# Patient Record
Sex: Male | Born: 1940 | Race: White | Hispanic: No | Marital: Single | Smoking: Former smoker
Health system: Southern US, Community
[De-identification: ages and names within clinical notes are randomized; demographics above are authoritative.]

## PROBLEM LIST (undated history)

## (undated) ENCOUNTER — Ambulatory Visit: Admission: EM | Payer: Medicare Other

## (undated) DIAGNOSIS — Z86011 Personal history of benign neoplasm of the brain: Secondary | ICD-10-CM

## (undated) DIAGNOSIS — I10 Essential (primary) hypertension: Secondary | ICD-10-CM

## (undated) HISTORY — PX: PROSTATE SURGERY: SHX751

## (undated) HISTORY — PX: BRAIN SURGERY: SHX531

## (undated) HISTORY — PX: APPENDECTOMY: SHX54

---

## 2018-01-10 ENCOUNTER — Ambulatory Visit
Admission: EM | Admit: 2018-01-10 | Discharge: 2018-01-10 | Disposition: A | Discharge status: Home / Self Care | Payer: Medicare Other | Attending: Family Medicine | Admitting: Family Medicine

## 2018-01-10 ENCOUNTER — Other Ambulatory Visit: Payer: Self-pay

## 2018-01-10 ENCOUNTER — Ambulatory Visit: Payer: Medicare Other

## 2018-01-10 ENCOUNTER — Encounter: Payer: Self-pay | Admitting: Emergency Medicine

## 2018-01-10 ENCOUNTER — Ambulatory Visit (INDEPENDENT_AMBULATORY_CARE_PROVIDER_SITE_OTHER): Payer: Medicare Other

## 2018-01-10 DIAGNOSIS — S50911A Unspecified superficial injury of right forearm, initial encounter: Secondary | ICD-10-CM | POA: Diagnosis not present

## 2018-01-10 DIAGNOSIS — M7989 Other specified soft tissue disorders: Secondary | ICD-10-CM

## 2018-01-10 DIAGNOSIS — Z23 Encounter for immunization: Secondary | ICD-10-CM

## 2018-01-10 DIAGNOSIS — S51811A Laceration without foreign body of right forearm, initial encounter: Secondary | ICD-10-CM

## 2018-01-10 DIAGNOSIS — S51812A Laceration without foreign body of left forearm, initial encounter: Secondary | ICD-10-CM

## 2018-01-10 HISTORY — DX: Essential (primary) hypertension: I10

## 2018-01-10 HISTORY — DX: Personal history of benign neoplasm of the brain: Z86.011

## 2018-01-10 MED ORDER — TETANUS-DIPHTH-ACELL PERTUSSIS 5-2.5-18.5 LF-MCG/0.5 IM SUSP
0.5000 mL | Freq: Once | INTRAMUSCULAR | Status: AC
  Administered 2018-01-10: 0.5 mL via INTRAMUSCULAR

## 2018-01-10 MED ORDER — ACETAMINOPHEN 500 MG PO TABS
1000.0000 mg | ORAL_TABLET | Freq: Once | ORAL | Status: AC
  Administered 2018-01-10: 1000 mg via ORAL

## 2018-01-10 MED ORDER — LIDOCAINE-EPINEPHRINE-TETRACAINE (LET) SOLUTION
3.0000 mL | Freq: Once | NASAL | Status: AC
  Administered 2018-01-10: 3 mL via TOPICAL

## 2018-01-10 MED ORDER — AMOXICILLIN 500 MG PO CAPS: 500.0000 mg | ORAL_CAPSULE | Freq: Three times a day (TID) | ORAL | 0 refills | Status: DC

## 2018-01-10 NOTE — Discharge Instructions (Signed)
Follow up in 7-8 days for suture removal or sooner if any problems

## 2018-01-10 NOTE — ED Provider Notes (Signed)
[facility]    CSN: [id number] Arrival date & time: 01/10/18  1205     History   Chief Complaint Chief Complaint  Patient presents with  . Extremity Laceration    right lower forearm    HPI Clinton Richardson is a 77 y.o. male.   77 yo male presents with a c/o laceration to right forearm after falling in a pond and lacerating it with a broken tree branch in the water. Last tetanus over 7 years ago.   The history is provided by the patient.    Past Medical History:  Diagnosis Date  . Hypertension   . Personal history of benign brain tumor     There are no active problems to display for this patient.   Past Surgical History:  Procedure Laterality Date  . APPENDECTOMY    . BRAIN SURGERY     benign brain tumor  . PROSTATE SURGERY         Home Medications    Prior to Admission medications   Medication Sig Start Date End Date Taking? Authorizing Provider  ASPIRIN 81 PO Take 1 tablet by mouth daily. 06/18/09  Yes [provider]  hydrochlorothiazide (HYDRODIURIL) 25 MG tablet Take 1 tablet by mouth daily. 04/30/17  Yes [provider]  lisinopril (PRINIVIL,ZESTRIL) 40 MG tablet Take 1 tablet by mouth daily. 05/04/17  Yes [provider]  Omega-3 1000 MG CAPS Take 1 tablet by mouth daily. 06/30/17  Yes [provider]  amoxicillin (AMOXIL) 500 MG capsule Take 1 capsule (500 mg total) by mouth 3 (three) times daily. 01/10/18   Norval Gable, MD    Family History Family History  Problem Relation Age of Onset  . Ovarian cancer Mother   . Heart attack Father   . Hypertension Father     Social History Social History   Tobacco Use  . Smoking status: Former Smoker    Last attempt to quit: 01/11/1968    Years since quitting: 50.0  . Smokeless tobacco: Never Used  Substance Use Topics  . Alcohol use: Yes    Comment: rarely  . Drug use: Never     Allergies   Azithromycin and Codeine   Review of Systems Review of  Systems   Physical Exam Triage Vital Signs ED Triage Vitals  Enc Vitals Group     BP 01/10/18 1255 (!) 143/81     Pulse Rate 01/10/18 1255 63     Resp 01/10/18 1255 16     Temp 01/10/18 1255 98.1 F (36.7 C)     Temp Source 01/10/18 1255 Oral     SpO2 01/10/18 1255 96 %     Weight 01/10/18 1256 210 lb (95.3 kg)     Height 01/10/18 1256 5\' 11"  (1.803 m)     Head Circumference --      Peak Flow --      Pain Score 01/10/18 1255 4     Pain Loc --      Pain Edu? --      Excl. in [place]? --    No data found.  Updated Vital Signs BP (!) 143/81 (BP Location: Left Arm)   Pulse 63   Temp 98.1 F (36.7 C) (Oral)   Resp 16   Ht 5\' 11"  (1.803 m)   Wt 95.3 kg   SpO2 96%   BMI 29.29 kg/m   Visual Acuity Right Eye Distance:   Left Eye Distance:   Bilateral Distance:    Right  Eye Near:   Left Eye Near:    Bilateral Near:     Physical Exam  Constitutional: He appears well-developed and well-nourished. No distress.  Musculoskeletal:       Right wrist: Normal.       Right forearm: He exhibits tenderness, swelling and laceration (2.5cm laceration). He exhibits no bony tenderness.       Right hand: Normal. He exhibits normal range of motion and normal capillary refill. Normal strength noted.  Skin: He is not diaphoretic.  Vitals reviewed.    UC Treatments / Results  Labs (all labs ordered are listed, but only abnormal results are displayed) Labs Reviewed - No data to display  EKG None  Radiology Dg Forearm Right  Result Date: 01/10/2018 CLINICAL DATA:  Pain following fall EXAM: RIGHT FOREARM - 2 VIEW COMPARISON:  None. FINDINGS: Frontal and lateral views were obtained. There is soft tissue injury volar to the distal radius and ulna. No radiopaque foreign body. No fracture or dislocation. Joint spaces appear normal. No elbow joint effusion. Pronator quadratus fat pad is not elevated. IMPRESSION: Soft tissue injury volar to the distal radius and ulna. No radiopaque foreign  body. No fracture or dislocation. No evident arthropathy. Electronically Signed   By: Lowella Grip III M.D.   On: 01/10/2018 13:38    Procedures Laceration Repair Date/Time: 01/10/2018 5:08 PM Performed by: Norval Gable, MD Authorized by: Norval Gable, MD   Consent:    Consent obtained:  Verbal   Consent given by:  Patient   Risks discussed:  Infection, need for additional repair, nerve damage, poor wound healing, poor cosmetic result, pain, retained foreign body, tendon damage and vascular damage   Alternatives discussed:  No treatment Anesthesia (see MAR for exact dosages):    Anesthesia method:  Topical application and local infiltration   Topical anesthetic:  LET   Local anesthetic:  Lidocaine 1% w/o epi Laceration details:    Location:  Shoulder/arm   Shoulder/arm location:  R lower arm   Length (cm):  2.5 Repair type:    Repair type:  Simple Pre-procedure details:    Preparation:  Patient was prepped and draped in usual sterile fashion and imaging obtained to evaluate for foreign bodies Exploration:    Hemostasis achieved with:  Direct pressure and LET   Wound exploration: wound explored through full range of motion and entire depth of wound probed and visualized     Wound extent: areolar tissue violated     Wound extent: no fascia violation noted, no foreign bodies/material noted, no muscle damage noted, no nerve damage noted, no tendon damage noted, no underlying fracture noted and no vascular damage noted   Treatment:    Area cleansed with:  Betadine and Hibiclens   Amount of cleaning:  Standard   Irrigation method:  Syringe   Foreign body removal: no foreign bodies visualized.   Skin repair:    Repair method:  Sutures   Suture size:  5-0   Suture material:  Nylon   Suture technique:  Simple interrupted   Number of sutures:  5 Approximation:    Approximation:  Close Post-procedure details:    Dressing:  Antibiotic ointment and non-adherent dressing    Patient tolerance of procedure:  Tolerated well, no immediate complications   (including critical care time)  Medications Ordered in UC Medications  acetaminophen (TYLENOL) tablet 1,000 mg (1,000 mg Oral Given 01/10/18 1314)  Tdap (BOOSTRIX) injection 0.5 mL (0.5 mLs Intramuscular Given 01/10/18 1347)  lidocaine-EPINEPHrine-tetracaine (LET)  solution (3 mLs Topical Given 01/10/18 1428)    Initial Impression / Assessment and Plan / UC Course  I have reviewed the triage vital signs and the nursing notes.  Pertinent labs & imaging results that were available during my care of the patient were reviewed by me and considered in my medical decision making (see chart for details).      Final Clinical Impressions(s) / UC Diagnoses   Final diagnoses:  Forearm laceration, left, initial encounter     Discharge Instructions     Follow up in 7-8 days for suture removal or sooner if any problems    ED Prescriptions    Medication Sig Dispense Auth. Provider   amoxicillin (AMOXIL) 500 MG capsule Take 1 capsule (500 mg total) by mouth 3 (three) times daily. 21 capsule Norval Gable, MD     1. x-ray results and diagnosis reviewed with patient; tdap given 2. rx as per orders above; reviewed possible side effects, interactions, risks and benefits; prophylaxis due to dirty pond water 3. Procedure note as above 4. Recommend supportive treatment with routine wound care 4. Follow-up in 7-8 days for suture removal or sooner prn if any problems Controlled Substance Prescriptions [place] Controlled Substance Registry consulted? Not Applicable   Norval Gable, MD 01/10/18 [phone]

## 2018-01-10 NOTE — ED Triage Notes (Signed)
Patient's last Tdap was 02/03/12 at [facility] per Care Everywhere.

## 2018-01-10 NOTE — ED Triage Notes (Signed)
Patient in today stating that he tri[place] on [place] arm on another tree stob. Patient unsure of last tetanus.

## 2018-01-17 ENCOUNTER — Ambulatory Visit
Admission: EM | Admit: 2018-01-17 | Discharge: 2018-01-17 | Disposition: A | Discharge status: Home / Self Care | Payer: Medicare Other | Attending: Family Medicine | Admitting: Family Medicine

## 2018-01-17 ENCOUNTER — Encounter: Payer: Self-pay | Admitting: Emergency Medicine

## 2018-01-17 ENCOUNTER — Other Ambulatory Visit: Payer: Self-pay

## 2018-01-17 DIAGNOSIS — Z4802 Encounter for removal of sutures: Secondary | ICD-10-CM

## 2018-01-17 MED ORDER — MUPIROCIN 2 % EX OINT: 1.0000 "application " | TOPICAL_OINTMENT | Freq: Two times a day (BID) | CUTANEOUS | 0 refills | Status: AC

## 2018-01-17 NOTE — Discharge Instructions (Signed)
Use the prescription topical.  Take care  Dr. Lacinda Axon

## 2018-01-17 NOTE — ED Triage Notes (Signed)
Pt is here today for suture removal.  Pt reports that he lost about half of the antibiotic so he did not finish it. Pt would like a provider to look at his wound, he is concerned for infection.

## 2018-01-17 NOTE — ED Provider Notes (Signed)
[facility]    CSN: [id number] Arrival date & time: 01/17/18  1020  History   Chief Complaint Chief Complaint  Patient presents with  . Suture / Staple Removal   HPI   77 year old male presents for suture removal.  Patient is concerned about infection and wanted a provider to examine him.  Patient suffered a laceration on 9/30.  It was repaired without difficulty.  Patient presents today for suture removal.  Patient states that the area is still swollen.  No drainage.  Patient lost his prescription after taking it for several days.  He is concerned that he may be developing an infection and wants to be examined.  Patient thinks he needs antibiotics.  No fever.  No chills.  No other associated symptoms.  No other complaints.  PMH, Surgical Hx, Family Hx, Social History reviewed and updated as below.  Past Medical History:  Diagnosis Date  . Hypertension   . Personal history of benign brain tumor    Past Surgical History:  Procedure Laterality Date  . APPENDECTOMY    . BRAIN SURGERY     benign brain tumor  . PROSTATE SURGERY     Home Medications    Prior to Admission medications   Medication Sig Start Date End Date Taking? Authorizing Provider  ASPIRIN 81 PO Take 1 tablet by mouth daily. 06/18/09  Yes [provider]  hydrochlorothiazide (HYDRODIURIL) 25 MG tablet Take 1 tablet by mouth daily. 04/30/17  Yes [provider]  lisinopril (PRINIVIL,ZESTRIL) 40 MG tablet Take 1 tablet by mouth daily. 05/04/17  Yes [provider]  Omega-3 1000 MG CAPS Take 1 tablet by mouth daily. 06/30/17  Yes [provider]  amoxicillin (AMOXIL) 500 MG capsule Take 1 capsule (500 mg total) by mouth 3 (three) times daily. 01/10/18   Norval Gable, MD  mupirocin ointment (BACTROBAN) 2 % Apply 1 application topically 2 (two) times daily for 7 days. 01/17/18 01/24/18  Coral Spikes, DO    Family History Family History  Problem Relation Age of Onset  .  Ovarian cancer Mother   . Heart attack Father   . Hypertension Father     Social History Social History   Tobacco Use  . Smoking status: Former Smoker    Last attempt to quit: 01/11/1968    Years since quitting: 50.0  . Smokeless tobacco: Never Used  Substance Use Topics  . Alcohol use: Yes    Comment: rarely  . Drug use: Never   Allergies   Azithromycin and Codeine   Review of Systems Review of Systems  Constitutional: Negative.   Skin: Positive for wound.   Physical Exam Triage Vital Signs ED Triage Vitals  Enc Vitals Group     BP 01/17/18 1039 (!) 153/73     Pulse Rate 01/17/18 1039 73     Resp 01/17/18 1039 16     Temp 01/17/18 1039 97.8 F (36.6 C)     Temp Source 01/17/18 1039 Oral     SpO2 01/17/18 1039 98 %     Weight 01/17/18 1037 205 lb (93 kg)     Height 01/17/18 1037 5\' 11"  (1.803 m)     Head Circumference --      Peak Flow --      Pain Score 01/17/18 1036 3     Pain Loc --      Pain Edu? --      Excl. in [place]? --    Updated Vital Signs BP Marland Kitchen)  153/73 (BP Location: Left Arm)   Pulse 73   Temp 97.8 F (36.6 C) (Oral)   Resp 16   Ht 5\' 11"  (1.803 m)   Wt 93 kg   SpO2 98%   BMI 28.59 kg/m   Visual Acuity Right Eye Distance:   Left Eye Distance:   Bilateral Distance:    Right Eye Near:   Left Eye Near:    Bilateral Near:     Physical Exam  Constitutional: He is oriented to person, place, and time. He appears well-developed. No distress.  HENT:  Head: Normocephalic and atraumatic.  Pulmonary/Chest: Effort normal. No respiratory distress.  Neurological: He is alert and oriented to person, place, and time.  Skin:  Right forearm (ventrally) -wound appears well-healed.  Sutures were removed by nursing staff without difficulty.  No drainage.  Psychiatric: He has a normal mood and affect. His behavior is normal.  Nursing note and vitals reviewed.  UC Treatments / Results  Labs (all labs ordered are listed, but only abnormal results are  displayed) Labs Reviewed - No data to display  EKG None  Radiology No results found.  Procedures Procedures (including critical care time) Sutures removed by nursing staff without difficulty.  Medications Ordered in UC Medications - No data to display  Initial Impression / Assessment and Plan / UC Course  I have reviewed the triage vital signs and the nursing notes.  Pertinent labs & imaging results that were available during my care of the patient were reviewed by me and considered in my medical decision making (see chart for details).    77 year old male presents for suture removal. Sutures removed without difficulty.  Mupirocin as directed. Supportive care.  Final Clinical Impressions(s) / UC Diagnoses   Final diagnoses:  Visit for suture removal     Discharge Instructions     Use the prescription topical.  Take care  Dr. Lacinda Axon     ED Prescriptions    Medication Sig Dispense Auth. Provider   mupirocin ointment (BACTROBAN) 2 % Apply 1 application topically 2 (two) times daily for 7 days. 22 g Coral Spikes, DO     Controlled Substance Prescriptions [place] Controlled Substance Registry consulted? Not Applicable   Coral Spikes, DO 01/17/18 1109

## 2018-02-12 ENCOUNTER — Ambulatory Visit
Admission: EM | Admit: 2018-02-12 | Discharge: 2018-02-12 | Disposition: A | Discharge status: Home / Self Care | Payer: Medicare Other | Attending: Family Medicine | Admitting: Family Medicine

## 2018-02-12 ENCOUNTER — Other Ambulatory Visit: Payer: Self-pay

## 2018-02-12 ENCOUNTER — Ambulatory Visit: Payer: Medicare Other

## 2018-02-12 DIAGNOSIS — W231XXA Caught, crushed, jammed, or pinched between stationary objects, initial encounter: Secondary | ICD-10-CM

## 2018-02-12 DIAGNOSIS — I10 Essential (primary) hypertension: Secondary | ICD-10-CM | POA: Diagnosis not present

## 2018-02-12 DIAGNOSIS — W230XXA Caught, crushed, jammed, or pinched between moving objects, initial encounter: Secondary | ICD-10-CM | POA: Insufficient documentation

## 2018-02-12 DIAGNOSIS — S60011A Contusion of right thumb without damage to nail, initial encounter: Secondary | ICD-10-CM

## 2018-02-12 DIAGNOSIS — Z87891 Personal history of nicotine dependence: Secondary | ICD-10-CM | POA: Diagnosis not present

## 2018-02-12 DIAGNOSIS — Z23 Encounter for immunization: Secondary | ICD-10-CM

## 2018-02-12 DIAGNOSIS — Z8249 Family history of ischemic heart disease and other diseases of the circulatory system: Secondary | ICD-10-CM | POA: Insufficient documentation

## 2018-02-12 DIAGNOSIS — Z79899 Other long term (current) drug therapy: Secondary | ICD-10-CM | POA: Diagnosis not present

## 2018-02-12 DIAGNOSIS — S61011A Laceration without foreign body of right thumb without damage to nail, initial encounter: Secondary | ICD-10-CM | POA: Insufficient documentation

## 2018-02-12 DIAGNOSIS — Z7982 Long term (current) use of aspirin: Secondary | ICD-10-CM | POA: Insufficient documentation

## 2018-02-12 MED ORDER — LIDOCAINE HCL (PF) 1 % IJ SOLN
10.0000 mL | Freq: Once | INTRAMUSCULAR | Status: AC
  Administered 2018-02-12: 10 mL

## 2018-02-12 MED ORDER — MUPIROCIN 2 % EX OINT: TOPICAL_OINTMENT | CUTANEOUS | 0 refills | Status: DC

## 2018-02-12 MED ORDER — CEPHALEXIN 500 MG PO CAPS: 500.0000 mg | ORAL_CAPSULE | Freq: Three times a day (TID) | ORAL | 0 refills | Status: AC

## 2018-02-12 MED ORDER — TETANUS-DIPHTH-ACELL PERTUSSIS 5-2.5-18.5 LF-MCG/0.5 IM SUSP
0.5000 mL | Freq: Once | INTRAMUSCULAR | Status: AC
  Administered 2018-02-12: 0.5 mL via INTRAMUSCULAR

## 2018-02-12 NOTE — ED Triage Notes (Signed)
Pt states he got the skin between the thumb and pointer finger pinch in the trailer hitch today. Pt has a lac noted with controlled bleeding

## 2018-02-12 NOTE — ED Provider Notes (Signed)
[facility] ____________________________________________  Time seen: Approximately 3:18 PM  I have reviewed the triage vital signs and the nursing notes.   HISTORY  Chief Complaint Laceration   HPI Clinton Richardson is a 77 y.o. male presenting for evaluation of right hand pain post injury with laceration that occurred approximately 2 hours prior to arrival.  Patient reports that he was unhooking a Marketing executive and his right hand got pinched at the bottom of his thumb as well as at the top.  Reports mild pain to the area.  Has not cleaned her irrigated wound.  No alleviating measures attempted.  States pain is worse with direct palpation.  States that he felt he needed a stitch to 1 of the cuts prompting him to come in.  Reports last tetanus immunization was in 2013 greater than 5 years ago.  Denies paresthesias, pain radiation or other complaints.  Reports otherwise doing well denies other complaints.  Denies recent sickness or antibodies.  Jeraldine Loots, MD: PCP  Past Medical History:  Diagnosis Date  . Hypertension   . Personal history of benign brain tumor     There are no active problems to display for this patient.   Past Surgical History:  Procedure Laterality Date  . APPENDECTOMY    . BRAIN SURGERY     benign brain tumor  . PROSTATE SURGERY       No current facility-administered medications for this encounter.   Current Outpatient Medications:  .  amoxicillin (AMOXIL) 500 MG capsule, Take 1 capsule (500 mg total) by mouth 3 (three) times daily., Disp: 21 capsule, Rfl: 0 .  ASPIRIN 81 PO, Take 1 tablet by mouth daily., Disp: , Rfl:  .  cephALEXin (KEFLEX) 500 MG capsule, Take 1 capsule (500 mg total) by mouth 3 (three) times daily for 5 days., Disp: 15 capsule, Rfl: 0 .  hydrochlorothiazide (HYDRODIURIL) 25 MG tablet, Take 1 tablet by mouth daily., Disp: , Rfl:  .  lisinopril (PRINIVIL,ZESTRIL) 40 MG tablet, Take 1 tablet by mouth daily., Disp: , Rfl:    .  mupirocin ointment (BACTROBAN) 2 %, Apply two times a day for 7 days., Disp: 22 g, Rfl: 0 .  Omega-3 1000 MG CAPS, Take 1 tablet by mouth daily., Disp: , Rfl:   Allergies Azithromycin and Codeine  Family History  Problem Relation Age of Onset  . Ovarian cancer Mother   . Heart attack Father   . Hypertension Father     Social History Social History   Tobacco Use  . Smoking status: Former Smoker    Last attempt to quit: 01/11/1968    Years since quitting: 50.1  . Smokeless tobacco: Never Used  Substance Use Topics  . Alcohol use: Yes    Comment: rarely  . Drug use: Never    Review of Systems Constitutional: No fever/chills Cardiovascular: Denies chest pain. Respiratory: Denies shortness of breath. Musculoskeletal: Negative for back pain. Skin: as above.    ____________________________________________   PHYSICAL EXAM:  VITAL SIGNS: ED Triage Vitals  Enc Vitals Group     BP 02/12/18 1455 (!) 152/105     Pulse Rate 02/12/18 1455 83     Resp 02/12/18 1455 16     Temp 02/12/18 1455 97.7 F (36.5 C)     Temp Source 02/12/18 1455 Oral     SpO2 02/12/18 1455 96 %     Weight 02/12/18 1456 205 lb (93 kg)     Height 02/12/18 1456 5\' 11"  (1.803 m)  Head Circumference --      Peak Flow --      Pain Score 02/12/18 1455 3     Pain Loc --      Pain Edu? --      Excl. in [place]? --     Constitutional: Alert and oriented. Well appearing and in no acute distress. ENT      Head: Normocephalic and atraumatic. Cardiovascular: Normal rate, regular rhythm. Grossly normal heart sounds.  Good peripheral circulation. Respiratory: Normal respiratory effort without tachypnea nor retractions. Breath sounds are clear and equal bilaterally. No wheezes, rales, rhonchi. Musculoskeletal: Bilateral distal radial pulses equal and easily palpated. Right hand palmar aspect proximal MCP 3.5 cm flap laceration present with mild active bleeding, dorsal base of thumb 1 cm superficial linear  laceration non-gapping, no foreign bodies visualized, mild pain at proximal first phalanx and MCP joint, full range of motion present, no motor or tendon deficits noted, good resisted flexion and extension. Neurologic:  Normal speech and language.  Speech is normal. No gait instability.  Skin:  Skin is warm, dry.  As above Psychiatric: Mood and affect are normal. Speech and behavior are normal. Patient exhibits appropriate insight and judgment   ___________________________________________   LABS (all labs ordered are listed, but only abnormal results are displayed)  Labs Reviewed - No data to display  RADIOLOGY  Dg Finger Thumb Right  Result Date: 02/12/2018 CLINICAL DATA:  Pinching injury today with pain and swelling, initial encounter EXAM: RIGHT THUMB 2+V COMPARISON:  None. FINDINGS: There is no evidence of fracture or dislocation. There is no evidence of arthropathy or other focal bone abnormality. Soft tissues are unremarkable IMPRESSION: No acute abnormality noted. Electronically Signed   By: Inez Catalina M.D.   On: 02/12/2018 15:47   ____________________________________________   PROCEDURES Procedures   Procedure(s) performed:  Procedure explained and verbal consent obtained. Consent: Verbal consent obtained. Written consent not obtained. Risks and benefits: risks, benefits and alternatives were discussed Patient identity confirmed: verbally with patient and hospital-assigned identification number  Consent given by: patient   Laceration Repair Location: right thumb Length: 3.5 cm Foreign bodies: no foreign bodies Tendon involvement: none Nerve involvement: none Preparation: Patient was prepped and draped in the usual sterile fashion. Anesthesia with 1% Lidocaine 6 mls Cleaned with Betadine Irrigation solution: saline Irrigation method: jet lavage Amount of cleaning: copious Repaired with 5-0 nylon Number of sutures: 7 Technique: simple interrupted  Approximation:  loose Patient tolerate well. Wound well approximated post repair.  Antibiotic ointment and dressing applied.  Wound care instructions provided.  Observe for any signs of infection or other problems.      INITIAL IMPRESSION / ASSESSMENT AND PLAN / ED COURSE  Pertinent labs & imaging results that were available during my care of the patient were reviewed by me and considered in my medical decision making (see chart for details).  Well-appearing patient.  No acute distress.  Right hand mechanical injury that occurred prior to arrival with laceration.  X-ray as above per radiologist and reviewed by myself, no acute abnormality noted.  Laceration copiously cleaned, irrigated and repaired as above.  Tetanus immunization updated.  Will Rx empiric Keflex and topical Bactroban.  Discussed keeping clean and wound care.  Return to urgent care in 7 to 10 days for suture removal.Discussed indication, risks and benefits of medications with patient.   Discussed follow up and return parameters including no resolution or any worsening concerns. Patient verbalized understanding and agreed to plan.  ____________________________________________   FINAL CLINICAL IMPRESSION(S) / ED DIAGNOSES  Final diagnoses:  Laceration of right thumb without foreign body without damage to nail, initial encounter  Contusion of right thumb without damage to nail, initial encounter     ED Discharge Orders         Ordered    cephALEXin (KEFLEX) 500 MG capsule  3 times daily     02/12/18 1611    mupirocin ointment (BACTROBAN) 2 %     02/12/18 1611           Note: This dictation was prepared with Dragon dictation along with smaller phrase technology. Any transcriptional errors that result from this process are unintentional.         Marylene Land, NP 02/12/18 1703

## 2018-02-12 NOTE — Discharge Instructions (Addendum)
Take medication as prescribed. Keep clean and dry as discussed. Monitor.   Return in 7-10 days for suture removal.   Follow up with your primary care physician this week as needed. Return to Urgent care for new or worsening concerns.

## 2018-02-22 ENCOUNTER — Ambulatory Visit
Admission: EM | Admit: 2018-02-22 | Discharge: 2018-02-22 | Disposition: A | Discharge status: Home / Self Care | Payer: Medicare Other

## 2018-02-22 NOTE — ED Triage Notes (Signed)
Pt with 7 sutures to right hand. No sx infection. Edges well approximated. No pain

## 2018-02-22 NOTE — ED Notes (Signed)
7 sutures removed from right palm.

## 2019-05-28 ENCOUNTER — Other Ambulatory Visit: Payer: Self-pay

## 2019-05-28 ENCOUNTER — Emergency Department: Payer: Medicare PPO

## 2019-05-28 ENCOUNTER — Emergency Department
Admission: EM | Admit: 2019-05-28 | Discharge: 2019-05-28 | Disposition: A | Discharge status: Home / Self Care | Payer: Medicare PPO | Attending: Emergency Medicine | Admitting: Emergency Medicine

## 2019-05-28 ENCOUNTER — Encounter: Payer: Self-pay | Admitting: Emergency Medicine

## 2019-05-28 DIAGNOSIS — Z7982 Long term (current) use of aspirin: Secondary | ICD-10-CM | POA: Insufficient documentation

## 2019-05-28 DIAGNOSIS — Z79899 Other long term (current) drug therapy: Secondary | ICD-10-CM | POA: Diagnosis not present

## 2019-05-28 DIAGNOSIS — Y939 Activity, unspecified: Secondary | ICD-10-CM | POA: Insufficient documentation

## 2019-05-28 DIAGNOSIS — Y92007 Garden or yard of unspecified non-institutional (private) residence as the place of occurrence of the external cause: Secondary | ICD-10-CM | POA: Insufficient documentation

## 2019-05-28 DIAGNOSIS — S29011A Strain of muscle and tendon of front wall of thorax, initial encounter: Secondary | ICD-10-CM | POA: Diagnosis not present

## 2019-05-28 DIAGNOSIS — X500XXA Overexertion from strenuous movement or load, initial encounter: Secondary | ICD-10-CM | POA: Diagnosis not present

## 2019-05-28 DIAGNOSIS — Y999 Unspecified external cause status: Secondary | ICD-10-CM | POA: Diagnosis not present

## 2019-05-28 DIAGNOSIS — I10 Essential (primary) hypertension: Secondary | ICD-10-CM | POA: Diagnosis not present

## 2019-05-28 DIAGNOSIS — S299XXA Unspecified injury of thorax, initial encounter: Secondary | ICD-10-CM | POA: Diagnosis present

## 2019-05-28 DIAGNOSIS — Z87891 Personal history of nicotine dependence: Secondary | ICD-10-CM | POA: Diagnosis not present

## 2019-05-28 LAB — CBC WITH DIFFERENTIAL/PLATELET
Abs Immature Granulocytes: 0.02 10*3/uL (ref 0.00–0.07)
Basophils Absolute: 0.1 10*3/uL (ref 0.0–0.1)
Basophils Relative: 1 %
Eosinophils Absolute: 0.2 10*3/uL (ref 0.0–0.5)
Eosinophils Relative: 2 %
HCT: 43.4 % (ref 39.0–52.0)
Hemoglobin: 14.9 g/dL (ref 13.0–17.0)
Immature Granulocytes: 0 %
Lymphocytes Relative: 29 %
Lymphs Abs: 1.9 10*3/uL (ref 0.7–4.0)
MCH: 29.7 pg (ref 26.0–34.0)
MCHC: 34.3 g/dL (ref 30.0–36.0)
MCV: 86.6 fL (ref 80.0–100.0)
Monocytes Absolute: 0.6 10*3/uL (ref 0.1–1.0)
Monocytes Relative: 9 %
Neutro Abs: 4 10*3/uL (ref 1.7–7.7)
Neutrophils Relative %: 59 %
Platelets: 252 10*3/uL (ref 150–400)
RBC: 5.01 MIL/uL (ref 4.22–5.81)
RDW: 13.3 % (ref 11.5–15.5)
WBC: 6.6 10*3/uL (ref 4.0–10.5)
nRBC: 0 % (ref 0.0–0.2)

## 2019-05-28 LAB — FIBRIN DERIVATIVES D-DIMER (ARMC ONLY): Fibrin derivatives D-dimer (ARMC): 522.06 ng/mL (FEU) — ABNORMAL HIGH (ref 0.00–499.00)

## 2019-05-28 LAB — COMPREHENSIVE METABOLIC PANEL
ALT: 28 U/L (ref 0–44)
AST: 25 U/L (ref 15–41)
Albumin: 3.8 g/dL (ref 3.5–5.0)
Alkaline Phosphatase: 67 U/L (ref 38–126)
Anion gap: 6 (ref 5–15)
BUN: 11 mg/dL (ref 8–23)
CO2: 29 mmol/L (ref 22–32)
Calcium: 8.7 mg/dL — ABNORMAL LOW (ref 8.9–10.3)
Chloride: 105 mmol/L (ref 98–111)
Creatinine, Ser: 1.09 mg/dL (ref 0.61–1.24)
GFR calc Af Amer: >60 mL/min (ref >60)
GFR calc non Af Amer: >60 mL/min (ref >60)
Glucose, Bld: 105 mg/dL — ABNORMAL HIGH (ref 70–99)
Potassium: 3.7 mmol/L (ref 3.5–5.1)
Sodium: 140 mmol/L (ref 135–145)
Total Bilirubin: 0.6 mg/dL (ref 0.3–1.2)
Total Protein: 6.7 g/dL (ref 6.5–8.1)

## 2019-05-28 LAB — TROPONIN I (HIGH SENSITIVITY)
Troponin I (High Sensitivity): 5 ng/L (ref <18)
Troponin I (High Sensitivity): 3 ng/L (ref <18)

## 2019-05-28 MED ORDER — PREDNISONE 10 MG PO TABS: 10.0000 mg | ORAL_TABLET | Freq: Every day | ORAL | 0 refills | Status: DC

## 2019-05-28 NOTE — ED Provider Notes (Signed)
EKG is reviewed inter by me at 1142 Heart rate 70 QRS 80 QTc 430 Normal sinus rhythm, occasional PAC.  No evidence acute ischemia  Medical screening examination/treatment/procedure(s) were performed by non-physician practitioner and as supervising physician I was immediately available for consultation/collaboration.     Delman Kitten, MD 05/28/19 1704

## 2019-05-28 NOTE — ED Triage Notes (Signed)
Pt to triage states he is having left sided chest pain that worsens with deep inspiration. Pt states he was working outside yesterday doing more work than he is used to and feels like he may have pulled a muscle.  Pt denies other s/s.

## 2019-05-28 NOTE — ED Notes (Signed)
Blue top resent to lab at this time

## 2019-05-28 NOTE — ED Provider Notes (Signed)
[facility] Emergency Department Provider Note  ____________________________________________  Time seen: Approximately 11:55 AM  I have reviewed the triage vital signs and the nursing notes.   HISTORY  Chief Complaint Chest Pain    HPI Clinton Richardson is a 79 y.o. male who presents the emergency department complaining of intermittent left-sided chest pain.  Patient states that yesterday he was outside trying to "bust up a [place]."  Patient states that he has upon his property that was blocked by 8 AM.  Patient states that he was lifting heavy items, trying to use pumps to bypass the blockage.  Patient states that last night he began to have some intermittent left-sided chest pain which is typically worsened with deep inspiration.  Patient states that at rest he has no pain, however with movement or deep inspiration he experiences left-sided pain.  No radiation into the jaw or down the left arm.  Patient states that this began last night and has progressed into this morning.  It has not worsened but it is also not improved this morning.  Patient denies any back pain, cardiac history.  No medications prior to arrival.         Past Medical History:  Diagnosis Date  . Hypertension   . Personal history of benign brain tumor     There are no problems to display for this patient.   Past Surgical History:  Procedure Laterality Date  . APPENDECTOMY    . BRAIN SURGERY     benign brain tumor  . PROSTATE SURGERY      Prior to Admission medications   Medication Sig Start Date End Date Taking? Authorizing Provider  amoxicillin (AMOXIL) 500 MG capsule Take 1 capsule (500 mg total) by mouth 3 (three) times daily. 01/10/18   Norval Gable, MD  ASPIRIN 81 PO Take 1 tablet by mouth daily. 06/18/09   [provider]  hydrochlorothiazide (HYDRODIURIL) 25 MG tablet Take 1 tablet by mouth daily. 04/30/17   [provider]  lisinopril (PRINIVIL,ZESTRIL) 40  MG tablet Take 1 tablet by mouth daily. 05/04/17   [provider]  mupirocin ointment (BACTROBAN) 2 % Apply two times a day for 7 days. 02/12/18   Marylene Land, NP  Omega-3 1000 MG CAPS Take 1 tablet by mouth daily. 06/30/17   [provider]  predniSONE (DELTASONE) 10 MG tablet Take 1 tablet (10 mg total) by mouth daily. 05/28/19   Lorik Guo, Charline Bills, PA-C    Allergies Azithromycin and Codeine  Family History  Problem Relation Age of Onset  . Ovarian cancer Mother   . Heart attack Father   . Hypertension Father     Social History Social History   Tobacco Use  . Smoking status: Former Smoker    Quit date: 01/11/1968    Years since quitting: 51.4  . Smokeless tobacco: Never Used  Substance Use Topics  . Alcohol use: Yes    Comment: rarely  . Drug use: Never     Review of Systems  Constitutional: No fever/chills Eyes: No visual changes. No discharge ENT: No upper respiratory complaints. Cardiovascular: Intermittent left-sided chest pain slightly pleuritic in nature. Respiratory: no cough. No SOB. Gastrointestinal: No abdominal pain.  No nausea, no vomiting.  No diarrhea.  No constipation. Genitourinary: Negative for dysuria. No hematuria Musculoskeletal: Negative for musculoskeletal pain. Skin: Negative for rash, abrasions, lacerations, ecchymosis. Neurological: Negative for headaches, focal weakness or numbness. 10-point ROS otherwise negative.  ____________________________________________   PHYSICAL EXAM:  VITAL SIGNS:  ED Triage Vitals  Enc Vitals Group     BP 05/28/19 1145 (!) 145/72     Pulse Rate 05/28/19 1145 70     Resp 05/28/19 1145 18     Temp 05/28/19 1145 98.4 F (36.9 C)     Temp Source 05/28/19 1145 Oral     SpO2 05/28/19 1145 98 %     Weight 05/28/19 1147 212 lb (96.2 kg)     Height 05/28/19 1147 5\' 11"  (1.803 m)     Head Circumference --      Peak Flow --      Pain Score 05/28/19 1147 0     Pain Loc --      Pain Edu? --       Excl. in [place]? --      Constitutional: Alert and oriented. Well appearing and in no acute distress. Eyes: Conjunctivae are normal. PERRL. EOMI. Head: Atraumatic. ENT:      Ears:       Nose: No congestion/rhinnorhea.      Mouth/Throat: Mucous membranes are moist.  Neck: No stridor.  No cervical spine tenderness to palpation.  Cardiovascular: Normal rate, regular rhythm. Normal S1 and S2. No murm  Good peripheral circulation. Respiratory: Normal respiratory effort without tachypnea or retractions. Lungs CTAB. Good air entry to the bases with no decreased or absent breath sounds. Musculoskeletal: Full range of motion to all extremities. No gross deformities appreciated. Neurologic:  Normal speech and language. No gross focal neurologic deficits are appreciated.  Skin:  Skin is warm, dry and intact. No rash noted. Psychiatric: Mood and affect are normal. Speech and behavior are normal. Patient exhibits appropriate insight and judgement.   ____________________________________________   LABS (all labs ordered are listed, but only abnormal results are displayed)  Labs Reviewed  COMPREHENSIVE METABOLIC PANEL - Abnormal; Notable for the following components:      Result Value   Glucose, Bld 105 (*)    Calcium 8.7 (*)    All other components within normal limits  FIBRIN DERIVATIVES D-DIMER ([facility] ONLY) - Abnormal; Notable for the following components:   Fibrin derivatives D-dimer ([facility]) 522.06 (*)    All other components within normal limits  CBC WITH DIFFERENTIAL/PLATELET  TROPONIN I (HIGH SENSITIVITY)  TROPONIN I (HIGH SENSITIVITY)   ____________________________________________  EKG  ED ECG REPORT I, Charline Bills Lucius Wise,  personally viewed and interpreted this ECG.   Date: 05/28/2019  EKG Time: 1141 hrs.  Rate: 71 bpm  Rhythm: there are no previous tracings available for comparison, normal sinus rhythm, PAC's noted  Axis: Normal Axis  Intervals:none  ST&T Change: No ST  elevation or depression noted  No STEMI.  Normal sinus rhythm.  PACs identified.  ____________________________________________  RADIOLOGY I personally viewed and evaluated these images as part of my medical decision making, as well as reviewing the written report by the radiologist.  DG Chest Port 1 View  Result Date: 05/28/2019 CLINICAL DATA:  LEFT-sided chest pain, worse with deep inspiration. EXAM: PORTABLE CHEST 1 VIEW COMPARISON:  None. FINDINGS: Borderline cardiomegaly. Lungs are clear. No pleural effusion or pneumothorax is seen. Osseous structures about the chest are unremarkable. IMPRESSION: No acute findings. No evidence of pneumonia or pulmonary edema. Borderline cardiomegaly. Electronically Signed   By: Franki Cabot M.D.   On: 05/28/2019 12:43    ____________________________________________    PROCEDURES  Procedure(s) performed:    Procedures    Medications - No data to display   ____________________________________________   INITIAL IMPRESSION /  ASSESSMENT AND PLAN / ED COURSE  Pertinent labs & imaging results that were available during my care of the patient were reviewed by me and considered in my medical decision making (see chart for details).  Review of the [place] CSRS was performed in accordance of the [place] prior to dispensing any controlled drugs.           Patient's diagnosis is consistent with chest wall pain.  Patient presented to the emergency department complaining of left-sided chest pain that was intermittent in nature.  This was worsened by deep inspiration, movement.  Patient states that he had been working to "bust a [place]" yesterday.  Patient developed symptoms last night and it persisted to today.  No cardiac history.  No radiation into the jaw or down the left upper extremity.  Overall patient's labs, chest x-ray, EKG are reassuring.  Patient had a D-dimer of 34, however age adjusted D-dimer reveals that this is below the cutoff.  Patient  does not have pleuritic pain as the pain is with movement of the rib cage, palpation of the ribs.  At this time, very low clinical concern for embolus.  Patient will be prescribed short course of steroid for muscle strain.  Follow-up primary care as needed..  Patient is given ED precautions to return to the ED for any worsening or new symptoms.     ____________________________________________  FINAL CLINICAL IMPRESSION(S) / ED DIAGNOSES  Final diagnoses:  Muscle strain of anterior chest wall      NEW MEDICATIONS STARTED DURING THIS VISIT:  ED Discharge Orders         Ordered    predniSONE (DELTASONE) 10 MG tablet  Daily    Note to Pharmacy: Take 6 pills x 2 days, 5 pills x 2 days, 4 pills x 2 days, 3 pills x 2 days, 2 pills x 2 days, and 1 pill x 2 days   05/28/19 1530              This chart was dictated using voice recognition software/Dragon. Despite best efforts to proofread, errors can occur which can change the meaning. Any change was purely unintentional.    Darletta Moll, PA-C 05/28/19 1533    Delman Kitten, MD 05/28/19 1705

## 2019-09-21 ENCOUNTER — Other Ambulatory Visit: Payer: Self-pay

## 2019-09-21 ENCOUNTER — Ambulatory Visit
Admission: EM | Admit: 2019-09-21 | Discharge: 2019-09-21 | Disposition: A | Discharge status: Home / Self Care | Payer: TRICARE For Life (TFL) | Attending: Family Medicine | Admitting: Family Medicine

## 2019-09-21 DIAGNOSIS — W4904XA Ring or other jewelry causing external constriction, initial encounter: Secondary | ICD-10-CM

## 2019-09-21 DIAGNOSIS — S61209A Unspecified open wound of unspecified finger without damage to nail, initial encounter: Secondary | ICD-10-CM

## 2019-09-21 DIAGNOSIS — S60449A External constriction of unspecified finger, initial encounter: Secondary | ICD-10-CM

## 2019-09-21 MED ORDER — MUPIROCIN 2 % EX OINT: TOPICAL_OINTMENT | CUTANEOUS | 0 refills | Status: DC

## 2019-09-21 MED ORDER — DOXYCYCLINE HYCLATE 100 MG PO CAPS: 100.0000 mg | ORAL_CAPSULE | Freq: Two times a day (BID) | ORAL | 0 refills | Status: DC

## 2019-09-21 NOTE — ED Provider Notes (Signed)
[facility]    CSN: [id number] Arrival date & time: 09/21/19  1105  History   Chief Complaint Chief Complaint  Patient presents with  . ring removal   HPI  79 year old male presents with the above complaint.  Patient reports that he has a ring stuck on his right fifth digit.  He states that he put on the ring approximately 1 week ago and was unable to get it off.  He states that it is causing pain.  Pain 5/10 in severity.  He has attempted to remove the ring and was unsuccessful.  No reports of trauma.  No other associated symptoms.  No other complaints.  Past Medical History:  Diagnosis Date  . Hypertension   . Personal history of benign brain tumor    Past Surgical History:  Procedure Laterality Date  . APPENDECTOMY    . BRAIN SURGERY     benign brain tumor  . PROSTATE SURGERY     Home Medications    Prior to Admission medications   Medication Sig Start Date End Date Taking? Authorizing Provider  ASPIRIN 81 PO Take 1 tablet by mouth daily. 06/18/09   [provider]  doxycycline (VIBRAMYCIN) 100 MG capsule Take 1 capsule (100 mg total) by mouth 2 (two) times daily. 09/21/19   Coral Spikes, DO  hydrochlorothiazide (HYDRODIURIL) 25 MG tablet Take 1 tablet by mouth daily. 04/30/17   [provider]  lisinopril (PRINIVIL,ZESTRIL) 40 MG tablet Take 1 tablet by mouth daily. 05/04/17   [provider]  mupirocin ointment (BACTROBAN) 2 % Apply two times a day for 7 days. 09/21/19   Nabiha Planck G, DO  Omega-3 1000 MG CAPS Take 1 tablet by mouth daily. 06/30/17   [provider]    Family History Family History  Problem Relation Age of Onset  . Ovarian cancer Mother   . Heart attack Father   . Hypertension Father     Social History Social History   Tobacco Use  . Smoking status: Former Smoker    Quit date: 01/11/1968    Years since quitting: 51.7  . Smokeless tobacco: Never Used  Vaping Use  . Vaping Use: Never used   Substance Use Topics  . Alcohol use: Yes    Comment: rarely  . Drug use: Never     Allergies   Azithromycin and Codeine   Review of Systems Review of Systems  Musculoskeletal:       Ring stuck - right 5th digit.   Physical Exam Triage Vital Signs ED Triage Vitals  Enc Vitals Group     BP 09/21/19 1114 (!) 134/95     Pulse Rate 09/21/19 1114 79     Resp 09/21/19 1114 18     Temp 09/21/19 1114 97.6 F (36.4 C)     Temp Source 09/21/19 1114 Oral     SpO2 09/21/19 1114 98 %     Weight 09/21/19 1115 210 lb (95.3 kg)     Height 09/21/19 1115 5\' 11"  (1.803 m)     Head Circumference --      Peak Flow --      Pain Score 09/21/19 1115 5     Pain Loc --      Pain Edu? --      Excl. in [place]? --    Updated Vital Signs BP (!) 134/95   Pulse 79   Temp 97.6 F (36.4 C) (Oral)   Resp 18   Ht 5\' 11"  (1.803  m)   Wt 95.3 kg   SpO2 98%   BMI 29.29 kg/m   Visual Acuity Right Eye Distance:   Left Eye Distance:   Bilateral Distance:    Right Eye Near:   Left Eye Near:    Bilateral Near:     Physical Exam Vitals and nursing note reviewed.  Constitutional:      General: He is not in acute distress.    Appearance: Normal appearance. He is not ill-appearing.  Pulmonary:     Effort: Pulmonary effort is normal. No respiratory distress.  Musculoskeletal:     Comments: Right fifth digit -swelling noted below the PIP joint.  Ring in place and unable to be easily removed.  There is mild surrounding erythema.  Neurological:     Mental Status: He is alert.  Psychiatric:        Mood and Affect: Mood normal.        Behavior: Behavior normal.    UC Treatments / Results  Labs (all labs ordered are listed, but only abnormal results are displayed) Labs Reviewed - No data to display  EKG   Radiology No results found.  Procedures Procedures (including critical care time) Electric ring cutter used today to cut the ring.  Ring was then easily removed by the  patient.  Medications Ordered in UC Medications - No data to display  Initial Impression / Assessment and Plan / UC Course  I have reviewed the triage vital signs and the nursing notes.  Pertinent labs & imaging results that were available during my care of the patient were reviewed by me and considered in my medical decision making (see chart for details).    79 year old male presents with a tight ring on his right fifth digit.  Ring was cut today and easily removed.  Small open wound was noted once ring was removed.  There is mild surrounding erythema.  Placing empirically on Bactroban ointment and doxycycline to cover for infection.  Wound was cleaned today.  Supportive care.  Final Clinical Impressions(s) / UC Diagnoses   Final diagnoses:  Tight ring on finger  Open wound of finger, initial encounter     Discharge Instructions     Keep clean.  Antibiotics as prescribed.  Take care  Dr. Lacinda Axon     ED Prescriptions    Medication Sig Dispense Auth. Provider   mupirocin ointment (BACTROBAN) 2 % Apply two times a day for 7 days. 22 g Leib Elahi G, DO   doxycycline (VIBRAMYCIN) 100 MG capsule Take 1 capsule (100 mg total) by mouth 2 (two) times daily. 14 capsule Thersa Salt G, DO     PDMP not reviewed this encounter.   Coral Spikes, [place] 09/21/19 1148

## 2019-09-21 NOTE — Discharge Instructions (Signed)
Keep clean.  Antibiotics as prescribed.  Take care  Dr. Lacinda Axon

## 2019-09-21 NOTE — ED Triage Notes (Signed)
Pt reports having a ring stuck on R pinky finger. Pt reports the ring has been stuck x1 week.

## 2020-04-05 IMAGING — DX DG CHEST 1V PORT
1 series · 2 of 2 positions shown · non-contrast
Comparison: None.

CLINICAL DATA: LEFT-sided chest pain, worse with deep inspiration.

EXAM:
PORTABLE CHEST 1 VIEW

[Series 2: chest ap · 0.14mm/px · 2 of 2 slices shown]
[im 1/2]
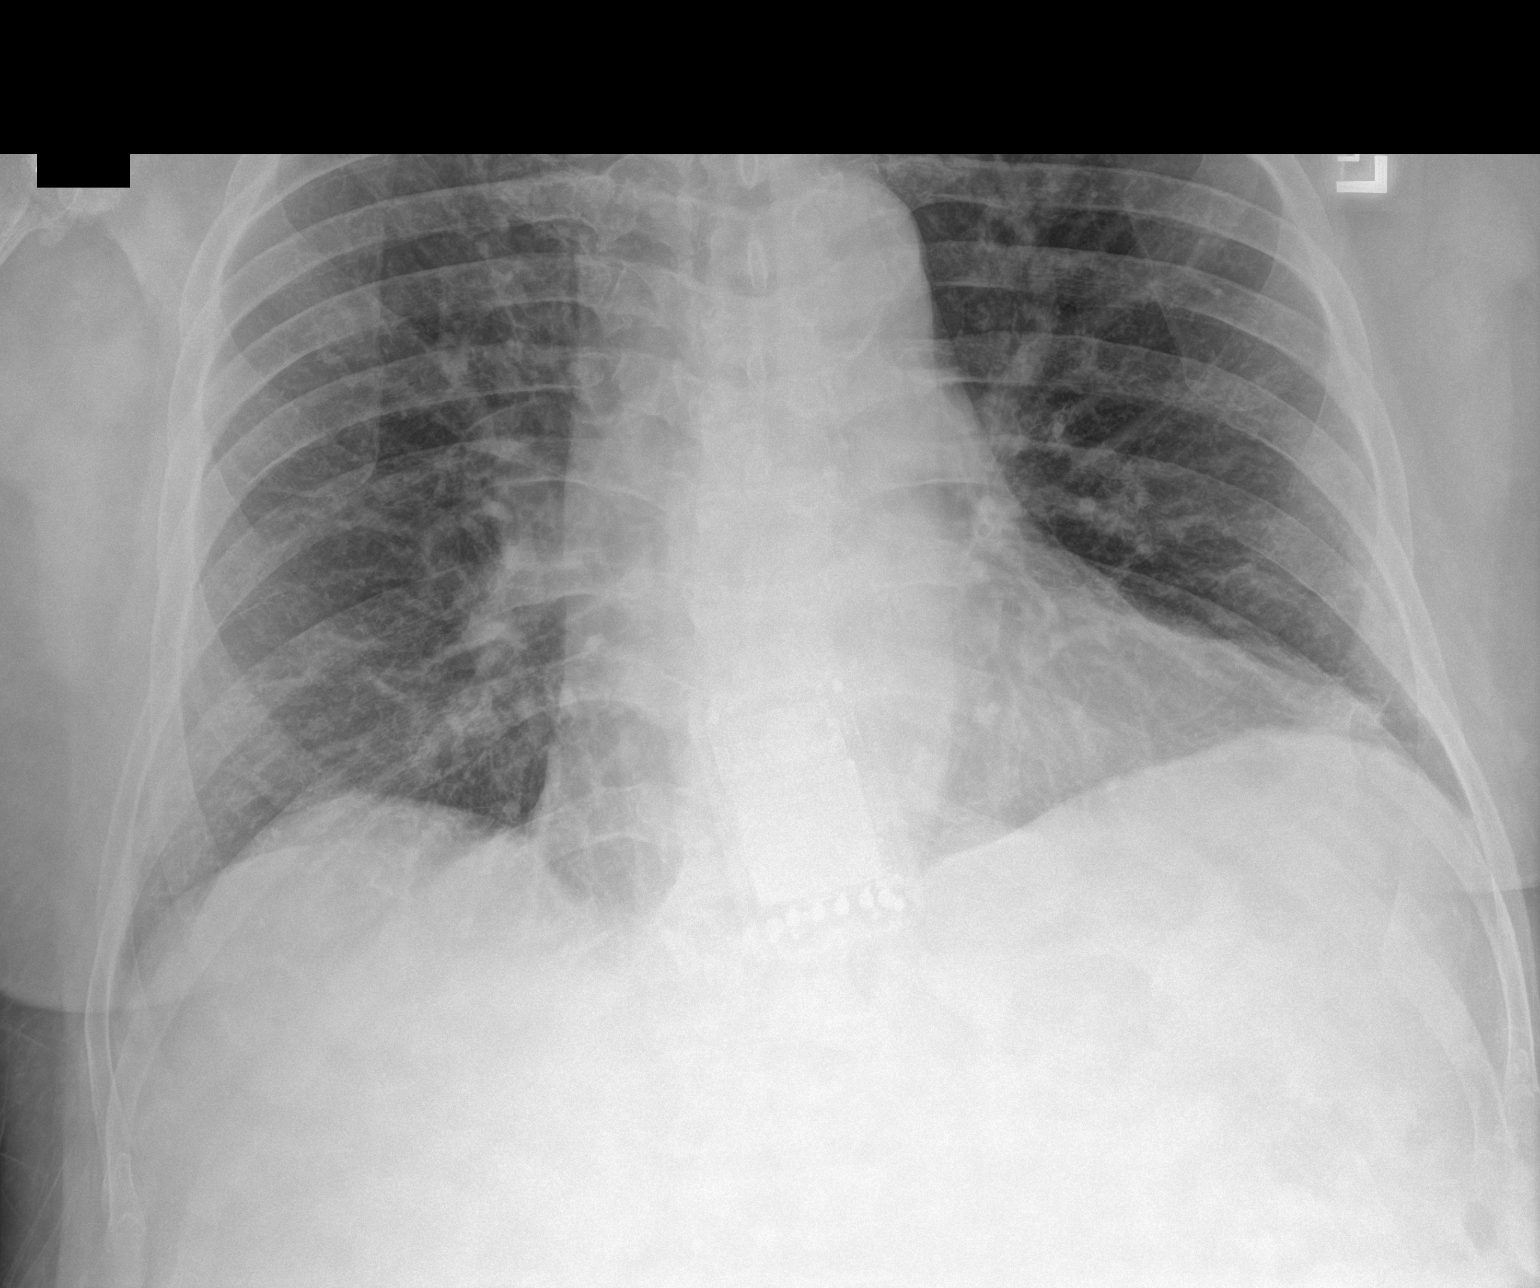
[im 2/2]
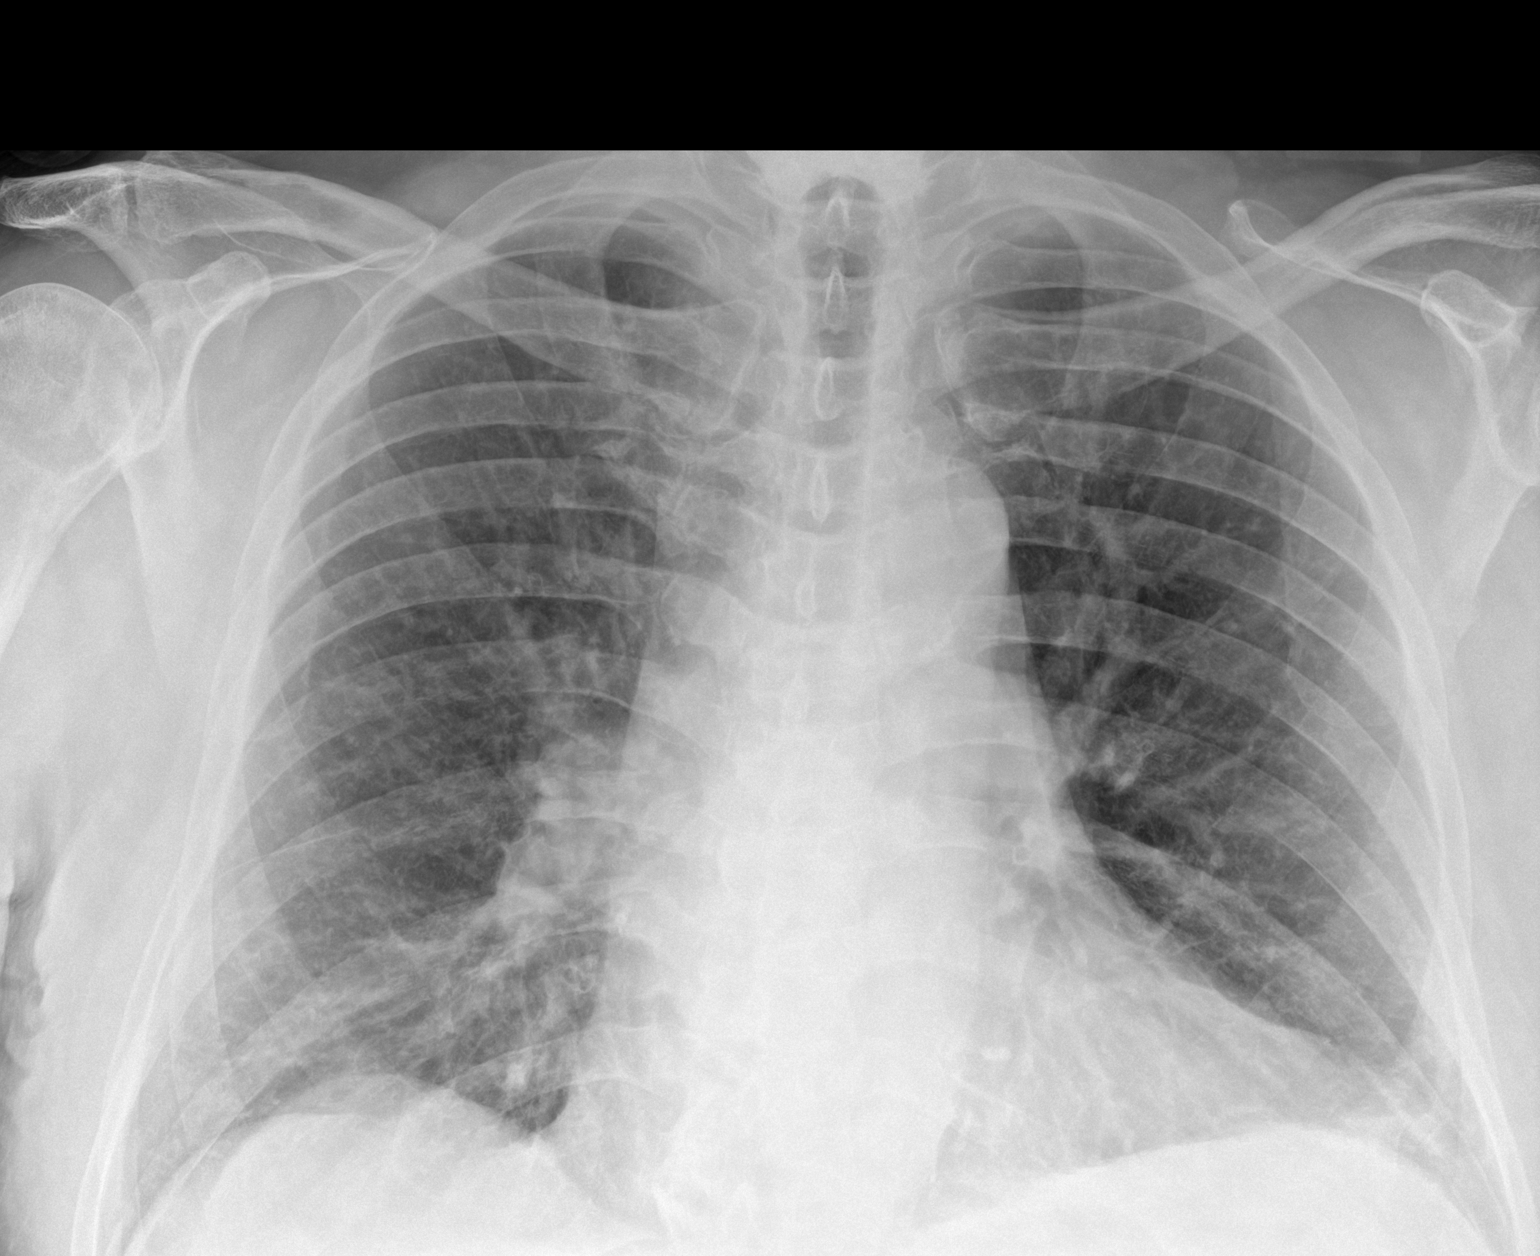

[2 of 2 positions shown; findings below may reference images not displayed]

FINDINGS: Borderline cardiomegaly. Lungs are clear. No pleural effusion or
pneumothorax is seen. Osseous structures about the chest are
unremarkable.
IMPRESSION: No acute findings. No evidence of pneumonia or pulmonary edema.
Borderline cardiomegaly.

## 2021-09-24 ENCOUNTER — Other Ambulatory Visit: Payer: Self-pay

## 2021-09-24 ENCOUNTER — Ambulatory Visit
Admission: RE | Admit: 2021-09-24 | Discharge: 2021-09-24 | Disposition: A | Discharge status: Home / Self Care | Payer: Medicare Other | Source: Ambulatory Visit | Attending: Internal Medicine | Admitting: Internal Medicine

## 2021-09-24 VITALS — BP 137/69 | HR 78 | Temp 98.2°F | Resp 17 | Ht 69.0 in | Wt 210.0 lb

## 2021-09-24 DIAGNOSIS — H6122 Impacted cerumen, left ear: Secondary | ICD-10-CM

## 2021-09-24 NOTE — ED Triage Notes (Signed)
Complains of left ear fullness and trouble hearing.

## 2021-09-24 NOTE — ED Provider Notes (Signed)
[facility]    CSN: [id number] Arrival date & time: 09/24/21  0853      History   Chief Complaint Chief Complaint  Patient presents with   Ear Fullness    HPI Clinton Richardson is a 81 y.o. male who presents with  L ear fullness x 1 week. He bought a kid to clean his ears, and applied some oil from it and feels that was the cause of it. Has decreased hearing on this side, but denies pain.    Past Medical History:  Diagnosis Date   Hypertension    Personal history of benign brain tumor     There are no problems to display for this patient.   Past Surgical History:  Procedure Laterality Date   APPENDECTOMY     BRAIN SURGERY     benign brain tumor   PROSTATE SURGERY         Home Medications    Prior to Admission medications   Medication Sig Start Date End Date Taking? Authorizing Provider  hydrochlorothiazide (HYDRODIURIL) 25 MG tablet Take 1 tablet by mouth daily. 04/30/17   [provider]  lisinopril (PRINIVIL,ZESTRIL) 40 MG tablet Take 1 tablet by mouth daily. 05/04/17   [provider]  Omega-3 1000 MG CAPS Take 1 tablet by mouth daily. 06/30/17   [provider]    Family History Family History  Problem Relation Age of Onset   Ovarian cancer Mother    Heart attack Father    Hypertension Father     Social History Social History   Tobacco Use   Smoking status: Former    Types: Cigarettes    Quit date: 01/11/1968    Years since quitting: 53.7   Smokeless tobacco: Never  Vaping Use   Vaping Use: Never used  Substance Use Topics   Alcohol use: Yes    Comment: rarely   Drug use: Never     Allergies   Azithromycin and Codeine   Review of Systems Review of Systems  Constitutional:  Negative for fever.  HENT:  Positive for hearing loss. Negative for ear discharge and ear pain.      Physical Exam Triage Vital Signs ED Triage Vitals [09/24/21 0901]  Enc Vitals Group     BP 137/69     Pulse Rate 78      Resp 17     Temp 98.2 F (36.8 C)     Temp Source Oral     SpO2 100 %     Weight 210 lb (95.3 kg)     Height '5\' 9"'$  (1.753 m)     Head Circumference      Peak Flow      Pain Score 0     Pain Loc      Pain Edu?      Excl. in [place]?    No data found.  Updated Vital Signs BP 137/69 (BP Location: Left Arm)   Pulse 78   Temp 98.2 F (36.8 C) (Oral)   Resp 17   Ht '5\' 9"'$  (1.753 m)   Wt 210 lb (95.3 kg)   SpO2 100%   BMI 31.01 kg/m   Visual Acuity Right Eye Distance:   Left Eye Distance:   Bilateral Distance:    Right Eye Near:   Left Eye Near:    Bilateral Near:     Physical Exam Vitals and nursing note reviewed.  Constitutional:      General: He is not in acute distress.  Appearance: He is not toxic-appearing.  HENT:     Right Ear: Tympanic membrane, ear canal and external ear normal.     Left Ear: There is impacted cerumen.     Ears:     Comments: After ear lavage, L TM healthy Eyes:     General: No scleral icterus.    Conjunctiva/sclera: Conjunctivae normal.  Pulmonary:     Effort: Pulmonary effort is normal.  Musculoskeletal:        General: Normal range of motion.  Skin:    General: Skin is warm and dry.  Neurological:     Mental Status: He is alert and oriented to person, place, and time.  Psychiatric:        Mood and Affect: Mood normal.        Behavior: Behavior normal.        Thought Content: Thought content normal.        Judgment: Judgment normal.      UC Treatments / Results  Labs (all labs ordered are listed, but only abnormal results are displayed) Labs Reviewed - No data to display  EKG   Radiology No results found.  Procedures Procedures (including critical care time)  Medications Ordered in UC Medications - No data to display  Initial Impression / Assessment and Plan / UC Course  I have reviewed the triage vital signs and the nursing notes.  Cerumen impaction  L ear lavage done and large piece of wax was removed.  Fu  prn Final Clinical Impressions(s) / UC Diagnoses   Final diagnoses:  Hearing loss due to cerumen impaction, left   Discharge Instructions   None    ED Prescriptions   None    PDMP not reviewed this encounter.   Shelby Mattocks, [place] 09/24/21 [phone]

## 2022-02-13 ENCOUNTER — Ambulatory Visit
Admission: EM | Admit: 2022-02-13 | Discharge: 2022-02-13 | Disposition: A | Discharge status: Home / Self Care | Payer: Medicare Other | Attending: Family Medicine | Admitting: Family Medicine

## 2022-02-13 ENCOUNTER — Ambulatory Visit: Payer: Medicare Other

## 2022-02-13 DIAGNOSIS — D72829 Elevated white blood cell count, unspecified: Secondary | ICD-10-CM | POA: Diagnosis present

## 2022-02-13 DIAGNOSIS — C189 Malignant neoplasm of colon, unspecified: Secondary | ICD-10-CM | POA: Insufficient documentation

## 2022-02-13 DIAGNOSIS — R1084 Generalized abdominal pain: Secondary | ICD-10-CM | POA: Diagnosis present

## 2022-02-13 DIAGNOSIS — R140 Abdominal distension (gaseous): Secondary | ICD-10-CM | POA: Insufficient documentation

## 2022-02-13 LAB — BASIC METABOLIC PANEL
Anion gap: 8 (ref 5–15)
BUN: 10 mg/dL (ref 8–23)
CO2: 29 mmol/L (ref 22–32)
Calcium: 9.2 mg/dL (ref 8.9–10.3)
Chloride: 102 mmol/L (ref 98–111)
Creatinine, Ser: 0.94 mg/dL (ref 0.61–1.24)
GFR, Estimated: >60 mL/min (ref >60)
Glucose, Bld: 144 mg/dL — ABNORMAL HIGH (ref 70–99)
Potassium: 3.7 mmol/L (ref 3.5–5.1)
Sodium: 139 mmol/L (ref 135–145)

## 2022-02-13 LAB — CBC WITH DIFFERENTIAL/PLATELET
Abs Immature Granulocytes: 0.06 10*3/uL (ref 0.00–0.07)
Basophils Absolute: 0 10*3/uL (ref 0.0–0.1)
Basophils Relative: 0 %
Eosinophils Absolute: 0 10*3/uL (ref 0.0–0.5)
Eosinophils Relative: 0 %
HCT: 37.5 % — ABNORMAL LOW (ref 39.0–52.0)
Hemoglobin: 12 g/dL — ABNORMAL LOW (ref 13.0–17.0)
Immature Granulocytes: 0 %
Lymphocytes Relative: 9 %
Lymphs Abs: 1.4 10*3/uL (ref 0.7–4.0)
MCH: 24.1 pg — ABNORMAL LOW (ref 26.0–34.0)
MCHC: 32 g/dL (ref 30.0–36.0)
MCV: 75.3 fL — ABNORMAL LOW (ref 80.0–100.0)
Monocytes Absolute: 1 10*3/uL (ref 0.1–1.0)
Monocytes Relative: 6 %
Neutro Abs: 14 10*3/uL — ABNORMAL HIGH (ref 1.7–7.7)
Neutrophils Relative %: 85 %
Platelets: 363 10*3/uL (ref 150–400)
RBC: 4.98 MIL/uL (ref 4.22–5.81)
RDW: 16.5 % — ABNORMAL HIGH (ref 11.5–15.5)
WBC: 16.6 10*3/uL — ABNORMAL HIGH (ref 4.0–10.5)
nRBC: 0 % (ref 0.0–0.2)

## 2022-02-13 MED ORDER — ONDANSETRON 8 MG PO TBDP: 8.0000 mg | ORAL_TABLET | Freq: Three times a day (TID) | ORAL | 0 refills | Status: DC | PRN

## 2022-02-13 MED ORDER — PROMETHAZINE HCL 25 MG/ML IJ SOLN
12.5000 mg | Freq: Once | INTRAMUSCULAR | Status: AC
  Administered 2022-02-13: 12.5 mg via INTRAMUSCULAR

## 2022-02-13 MED ORDER — NAPROXEN 500 MG PO TABS: 500.0000 mg | ORAL_TABLET | Freq: Two times a day (BID) | ORAL | 0 refills | Status: DC

## 2022-02-13 NOTE — ED Provider Notes (Signed)
[facility]    CSN: [id number] Arrival date & time: 02/13/22  1741      History   Chief Complaint Chief Complaint  Patient presents with   Abdominal Pain    Abdominal pain. X1 week    HPI Clinton Richardson is a 81 y.o. male.   HPI  Clinton Richardson presents for generalized abdominal pain.  Reports persistent nausea and intermittent vomiting for the past couple of days.  States he ate some bad food on Wednesday.  Thursday he started having of belly pain and Friday he felt awful.  He has known issues with his colon.  He has been burping a lot.  There was a time where he was not making very much urine or able to eat or drink.  He had 4 episodes of emesis that were nonbloody.  Earlier this morning his stomach felt like he was very bloated.  The bloating has gone away but he still feels intermittent pain in his belly.  States he is due to get an MRI and has a surgery appointment coming up next week.  He had a colonoscopy and some scans last month.  Endorses constipation.  No blood in his stool or urine.  No diarrhea.  He has an appetite but he is unsure what to eat.  He had his appendix removed. He has stones in his gallbladder.     Past Medical History:  Diagnosis Date   Hypertension    Personal history of benign brain tumor     There are no problems to display for this patient.   Past Surgical History:  Procedure Laterality Date   APPENDECTOMY     BRAIN SURGERY     benign brain tumor   PROSTATE SURGERY         Home Medications    Prior to Admission medications   Medication Sig Start Date End Date Taking? Authorizing Provider  atorvastatin (LIPITOR) 10 MG tablet Take 10 mg by mouth daily.   Yes [provider]  hydrochlorothiazide (HYDRODIURIL) 25 MG tablet Take 1 tablet by mouth daily. 04/30/17  Yes [provider]  lisinopril (PRINIVIL,ZESTRIL) 40 MG tablet Take 1 tablet by mouth daily. 05/04/17  Yes [provider]  naproxen (NAPROSYN) 500  MG tablet Take 1 tablet (500 mg total) by mouth 2 (two) times daily with a meal. 02/13/22  Yes Oneida Mckamey, DO  Omega-3 1000 MG CAPS Take 1 tablet by mouth daily. 06/30/17  Yes [provider]  ondansetron (ZOFRAN-ODT) 8 MG disintegrating tablet Take 1 tablet (8 mg total) by mouth every 8 (eight) hours as needed for nausea or vomiting. 02/13/22  Yes Lyndee Hensen, DO    Family History Family History  Problem Relation Age of Onset   Ovarian cancer Mother    Heart attack Father    Hypertension Father     Social History Social History   Tobacco Use   Smoking status: Former    Types: Cigarettes    Quit date: 01/11/1968    Years since quitting: 54.1   Smokeless tobacco: Never  Vaping Use   Vaping Use: Never used  Substance Use Topics   Alcohol use: Yes    Comment: rarely   Drug use: Never     Allergies   Azithromycin and Codeine   Review of Systems Review of Systems :negative unless otherwise stated in HPI.      Physical Exam Triage Vital Signs ED Triage Vitals  Enc Vitals Group     BP  02/13/22 1836 137/64     Pulse Rate 02/13/22 1836 87     Resp 02/13/22 1836 16     Temp 02/13/22 1836 98.3 F (36.8 C)     Temp Source 02/13/22 1836 Oral     SpO2 02/13/22 1836 97 %     Weight 02/13/22 1834 203 lb (92.1 kg)     Height 02/13/22 1834 '5\' 11"'$  (1.803 m)     Head Circumference --      Peak Flow --      Pain Score 02/13/22 1834 7     Pain Loc --      Pain Edu? --      Excl. in [place]? --    No data found.  Updated Vital Signs BP 137/64 (BP Location: Left Arm)   Pulse 87   Temp 98.3 F (36.8 C) (Oral)   Resp 16   Ht '5\' 11"'$  (1.803 m)   Wt 92.1 kg   SpO2 97%   BMI 28.31 kg/m   Visual Acuity Right Eye Distance:   Left Eye Distance:   Bilateral Distance:    Right Eye Near:   Left Eye Near:    Bilateral Near:     Physical Exam  GEN: pleasant non-toxic appearing elderly male, in no acute distress   CV: regular rate and rhythm RESP: no increased  work of breathing, clear to ascultation bilaterally ABD: Bowel sounds present. Soft, non-tender, mildly distended.  No guarding, no rebound, no appreciable hepatosplenomegaly, no CVA tenderness, negative Murphy MSK: no extremity edema SKIN: warm, dry, no rash on visible skin NEURO: alert, moves all extremities appropriately PSYCH: Normal affect, appropriate speech and behavior   UC Treatments / Results  Labs (all labs ordered are listed, but only abnormal results are displayed) Labs Reviewed  CBC WITH DIFFERENTIAL/PLATELET - Abnormal; Notable for the following components:      Result Value   WBC 16.6 (*)    Hemoglobin 12.0 (*)    HCT 37.5 (*)    MCV 75.3 (*)    MCH 24.1 (*)    RDW 16.5 (*)    Neutro Abs 14.0 (*)    All other components within normal limits  BASIC METABOLIC PANEL - Abnormal; Notable for the following components:   Glucose, Bld 144 (*)    All other components within normal limits    EKG   Radiology No results found.  Procedures Procedures (including critical care time)  Medications Ordered in UC Medications  promethazine (PHENERGAN) injection 12.5 mg (12.5 mg Intramuscular Given 02/13/22 1939)    Initial Impression / Assessment and Plan / UC Course  I have reviewed the triage vital signs and the nursing notes.  Pertinent labs & imaging results that were available during my care of the patient were reviewed by me and considered in my medical decision making (see chart for details).        Patient is a  81 y.o. male with history colon cancer who presents after having insidious abdominal pain about 4 days ago.  Patient is non-toxic-appearing, adequately-hydrated, and in no acute distress.  Vital signs stable.  Clinton Richardson is afebrile. Given promethazine for nausea and vomiting.   On chart review, pt had colonoscopy and endoscopy 01/21/22 that showed a fungating and infiltrative partially obstructing mass in the cecum. He had a CT ABD/Pelvis on 01/30/22 that  showed a mass near the ileocecal valve/proximal cecum. He has known gallstones.   Exam is not concerning for an acute abdomen however history  is concerning for bowel obstruction.  Considered labs and imaging but we do not have CT available here. I do not want to delay his care. Recommended ED evaluation but patient declined. Labs drawn. Pt with leukocytosis, WBC 16.6 however 9 days ago was 8.0. He has microcytic anemia which is stable.   There was no significant electrolyte abnormalities. Pt advised to go the emergency department for additional imaging.  He again declined.  He will sign out AMA.  Follow-up, return and ED precautions given.  Discussed MDM, treatment plan and plan for follow-up with patient/parent who agrees with plan.    Final Clinical Impressions(s) / UC Diagnoses   Final diagnoses:  Generalized abdominal pain  Bloating  Malignant neoplasm of colon, unspecified part of colon ([place])  Leukocytosis, unspecified type     Discharge Instructions      You have been advised to follow up immediately in the emergency department for concerning signs or symptoms as discussed during your visit. If you declined EMS transport, please have a family member take you directly to the ED at this time. Do not delay.   Based on concerns about condition, if you do not follow up in the ED, you may risk poor outcomes including worsening of condition, delayed treatment and potentially life threatening issues. If you have declined to go to the ED at this time, you should call your PCP immediately to set up a follow up appointment.   Go to ED for red flag symptoms, including; fevers you cannot reduce with Tylenol/Motrin, severe headaches, vision changes, numbness/weakness in part of the body, lethargy, confusion, intractable vomiting, severe dehydration, chest pain, breathing difficulty, severe persistent abdominal or pelvic pain, signs of severe infection (increased redness, swelling of an area), feeling  faint or passing out, dizziness, etc. You should especially go to the ED for sudden acute worsening of condition if you do not elect to go at this time.       ED Prescriptions     Medication Sig Dispense Auth. Provider   ondansetron (ZOFRAN-ODT) 8 MG disintegrating tablet Take 1 tablet (8 mg total) by mouth every 8 (eight) hours as needed for nausea or vomiting. 20 tablet Krish Bailly, DO   naproxen (NAPROSYN) 500 MG tablet Take 1 tablet (500 mg total) by mouth 2 (two) times daily with a meal. 30 tablet Archer Moist, DO      I have reviewed the PDMP during this encounter.   Lyndee Hensen, DO 02/13/22 2035

## 2022-02-13 NOTE — ED Triage Notes (Signed)
Pt states that he has some abdominal pain. X1 week Pt states that he has some nausea.

## 2022-02-13 NOTE — Discharge Instructions (Addendum)

## 2022-04-22 ENCOUNTER — Ambulatory Visit (INDEPENDENT_AMBULATORY_CARE_PROVIDER_SITE_OTHER): Payer: Medicare Other

## 2022-04-22 ENCOUNTER — Ambulatory Visit
Admission: EM | Admit: 2022-04-22 | Discharge: 2022-04-22 | Disposition: A | Discharge status: Home / Self Care | Payer: Medicare Other | Attending: Family Medicine | Admitting: Family Medicine

## 2022-04-22 DIAGNOSIS — R1011 Right upper quadrant pain: Secondary | ICD-10-CM

## 2022-04-22 DIAGNOSIS — K810 Acute cholecystitis: Secondary | ICD-10-CM | POA: Diagnosis present

## 2022-04-22 DIAGNOSIS — K8020 Calculus of gallbladder without cholecystitis without obstruction: Secondary | ICD-10-CM | POA: Diagnosis present

## 2022-04-22 DIAGNOSIS — K862 Cyst of pancreas: Secondary | ICD-10-CM

## 2022-04-22 LAB — CBC WITH DIFFERENTIAL/PLATELET
Abs Immature Granulocytes: 0.1 10*3/uL — ABNORMAL HIGH (ref 0.00–0.07)
Basophils Absolute: 0.1 10*3/uL (ref 0.0–0.1)
Basophils Relative: 0 %
Eosinophils Absolute: 0 10*3/uL (ref 0.0–0.5)
Eosinophils Relative: 0 %
HCT: 38.5 % — ABNORMAL LOW (ref 39.0–52.0)
Hemoglobin: 12.3 g/dL — ABNORMAL LOW (ref 13.0–17.0)
Immature Granulocytes: 1 %
Lymphocytes Relative: 6 %
Lymphs Abs: 1.1 10*3/uL (ref 0.7–4.0)
MCH: 24.3 pg — ABNORMAL LOW (ref 26.0–34.0)
MCHC: 31.9 g/dL (ref 30.0–36.0)
MCV: 75.9 fL — ABNORMAL LOW (ref 80.0–100.0)
Monocytes Absolute: 1.1 10*3/uL — ABNORMAL HIGH (ref 0.1–1.0)
Monocytes Relative: 6 %
Neutro Abs: 14.2 10*3/uL — ABNORMAL HIGH (ref 1.7–7.7)
Neutrophils Relative %: 87 %
Platelets: 263 10*3/uL (ref 150–400)
RBC: 5.07 MIL/uL (ref 4.22–5.81)
RDW: 21.6 % — ABNORMAL HIGH (ref 11.5–15.5)
WBC: 16.5 10*3/uL — ABNORMAL HIGH (ref 4.0–10.5)
nRBC: 0 % (ref 0.0–0.2)

## 2022-04-22 LAB — COMPREHENSIVE METABOLIC PANEL
ALT: 25 U/L (ref 0–44)
AST: 30 U/L (ref 15–41)
Albumin: 3.5 g/dL (ref 3.5–5.0)
Alkaline Phosphatase: 123 U/L (ref 38–126)
Anion gap: 7 (ref 5–15)
BUN: 9 mg/dL (ref 8–23)
CO2: 24 mmol/L (ref 22–32)
Calcium: 8.7 mg/dL — ABNORMAL LOW (ref 8.9–10.3)
Chloride: 103 mmol/L (ref 98–111)
Creatinine, Ser: 0.8 mg/dL (ref 0.61–1.24)
GFR, Estimated: >60 mL/min (ref >60)
Glucose, Bld: 139 mg/dL — ABNORMAL HIGH (ref 70–99)
Potassium: 3.6 mmol/L (ref 3.5–5.1)
Sodium: 134 mmol/L — ABNORMAL LOW (ref 135–145)
Total Bilirubin: 1.1 mg/dL (ref 0.3–1.2)
Total Protein: 7 g/dL (ref 6.5–8.1)

## 2022-04-22 LAB — LIPASE, BLOOD: Lipase: 28 U/L (ref 11–51)

## 2022-04-22 MED ORDER — IOHEXOL 300 MG/ML  SOLN
100.0000 mL | Freq: Once | INTRAMUSCULAR | Status: AC | PRN
  Administered 2022-04-22: 100 mL via INTRAVENOUS

## 2022-04-22 MED ORDER — KETOROLAC TROMETHAMINE 60 MG/2ML IM SOLN
30.0000 mg | Freq: Once | INTRAMUSCULAR | Status: AC
  Administered 2022-04-22: 30 mg via INTRAMUSCULAR

## 2022-04-22 NOTE — ED Provider Notes (Signed)
[facility]    CSN: [id number] Arrival date & time: 04/22/22  1021      History   Chief Complaint Chief Complaint  Patient presents with   Abdominal Pain    HPI Clinton Richardson is a 82 y.o. male.   HPI  [place] presents for right sided abdominal pain since last night.  Reports making himself vomit last night after eating Popeyes coleslaw to see if it would make the pain better..  States that he does not believe that the close likely with him.  He has been having sharp abdominal pain in the side.  He had a bowel movement this morning but does not feel like it completed.  He has been taking some Tums  but does not feel like they helped. At some yogurt today.  Nothing seems to make the pain better or worse.  Denies any bright red blood or really dark stools.  There has been no recent fever, chills or bodyaches.  Past Surgeries: Colectomy in November 2023   Past Medical History:  Diagnosis Date   Hypertension    Personal history of benign brain tumor     There are no problems to display for this patient.   Past Surgical History:  Procedure Laterality Date   APPENDECTOMY     BRAIN SURGERY     benign brain tumor   PROSTATE SURGERY         Home Medications    Prior to Admission medications   Medication Sig Start Date End Date Taking? Authorizing Provider  atorvastatin (LIPITOR) 10 MG tablet Take 10 mg by mouth daily.   Yes [provider]  hydrochlorothiazide (HYDRODIURIL) 25 MG tablet Take 1 tablet by mouth daily. 04/30/17  Yes [provider]  lisinopril (PRINIVIL,ZESTRIL) 40 MG tablet Take 1 tablet by mouth daily. 05/04/17  Yes [provider]  naproxen (NAPROSYN) 500 MG tablet Take 1 tablet (500 mg total) by mouth 2 (two) times daily with a meal. 02/13/22  Yes Dael Howland, DO  Omega-3 1000 MG CAPS Take 1 tablet by mouth daily. 06/30/17  Yes [provider]  ondansetron (ZOFRAN-ODT) 8 MG disintegrating tablet Take 1  tablet (8 mg total) by mouth every 8 (eight) hours as needed for nausea or vomiting. 02/13/22  Yes Lyndee Hensen, DO  tamsulosin (FLOMAX) 0.4 MG CAPS capsule  06/13/10  Yes [provider]    Family History Family History  Problem Relation Age of Onset   Ovarian cancer Mother    Heart attack Father    Hypertension Father     Social History Social History   Tobacco Use   Smoking status: Former    Types: Cigarettes    Quit date: 01/11/1968    Years since quitting: 54.3   Smokeless tobacco: Never  Vaping Use   Vaping Use: Never used  Substance Use Topics   Alcohol use: Yes    Comment: rarely   Drug use: Never     Allergies   Azithromycin and Codeine   Review of Systems Review of Systems :negative unless otherwise stated in HPI.      Physical Exam Triage Vital Signs ED Triage Vitals  Enc Vitals Group     BP 04/22/22 1038 138/68     Pulse Rate 04/22/22 1038 87     Resp --      Temp 04/22/22 1038 97.9 F (36.6 C)     Temp Source 04/22/22 1038 Oral     SpO2 04/22/22 1038 97 %  Weight 04/22/22 1036 195 lb (88.5 kg)     Height 04/22/22 1036 '5\' 11"'$  (1.803 m)     Head Circumference --      Peak Flow --      Pain Score 04/22/22 1035 10     Pain Loc --      Pain Edu? --      Excl. in [place]? --    No data found.  Updated Vital Signs BP 138/68 (BP Location: Left Arm)   Pulse 87   Temp 97.9 F (36.6 C) (Oral)   Ht '5\' 11"'$  (1.803 m)   Wt 88.5 kg   SpO2 97%   BMI 27.20 kg/m   Visual Acuity Right Eye Distance:   Left Eye Distance:   Bilateral Distance:    Right Eye Near:   Left Eye Near:    Bilateral Near:     Physical Exam  GEN: pleasant non-toxic appearing male, in no acute distress  CV: regular rate and rhythm RESP: no increased work of breathing, clear to ascultation bilaterally ABD: Bowel sounds present. Soft, RUQ>RLQ tenderness, No guarding, no rebound, no CVA tenderness MSK: no extremity edema, baseline ROM SKIN: warm, dry, NEURO:  alert, moves all extremities appropriately PSYCH: Normal affect, appropriate speech and behavior   UC Treatments / Results  Labs (all labs ordered are listed, but only abnormal results are displayed) Labs Reviewed  CBC WITH DIFFERENTIAL/PLATELET - Abnormal; Notable for the following components:      Result Value   WBC 16.5 (*)    Hemoglobin 12.3 (*)    HCT 38.5 (*)    MCV 75.9 (*)    MCH 24.3 (*)    RDW 21.6 (*)    Neutro Abs 14.2 (*)    Monocytes Absolute 1.1 (*)    Abs Immature Granulocytes 0.10 (*)    All other components within normal limits  COMPREHENSIVE METABOLIC PANEL - Abnormal; Notable for the following components:   Sodium 134 (*)    Glucose, Bld 139 (*)    Calcium 8.7 (*)    All other components within normal limits  LIPASE, BLOOD    EKG   Radiology CT ABDOMEN PELVIS W CONTRAST  Addendum Date: 04/22/2022   ADDENDUM REPORT: 04/22/2022 13:35 ADDENDUM: These results were called by telephone at the time of interpretation on 04/22/2022 at 1:01 p.m. to provider [facility] , who verbally acknowledged these results. Electronically Signed   By: Emmit Alexanders M.D   On: 04/22/2022 13:35   Result Date: 04/22/2022 CLINICAL DATA:  Right lower quadrant abdominal pain EXAM: CT ABDOMEN AND PELVIS WITH CONTRAST TECHNIQUE: Multidetector CT imaging of the abdomen and pelvis was performed using the standard protocol following bolus administration of intravenous contrast. RADIATION DOSE REDUCTION: This exam was performed according to the departmental dose-optimization program which includes automated exposure control, adjustment of the mA and/or kV according to patient size and/or use of iterative reconstruction technique. CONTRAST:  100 mL OMNIPAQUE IOHEXOL 300 MG/ML  SOLN COMPARISON:  None Available. FINDINGS: Lower chest: Linear opacity in the left lower lobe, favored atelectasis. No pericardial effusion. Hepatobiliary: No focal abnormality in liver. Mixed attenuation lesion within  the gallbladder, favored to represent heterogeneous gallstone with associated gallbladder wall thickening measuring up to 6 mm with surrounding fat stranding. The common bile duct is dilated, measuring 12.5 mm on series 4, image 42. Pancreas: Cystic lesion in the head of the pancreas measuring up to 2.6 x 1.9 cm on series 2, image 31. The remainder of  the pancreas is atrophic with patchy fatty replacement. Spleen: Negative. Adrenals/Urinary Tract: Left renal perihilar cysts (no imaging follow-up is recommended). Normal right kidney. Normal enhancement of the collecting system. Normal adrenal glands. Stomach/Bowel: The stomach is underdistended. Small bowel loops are nondilated. Postoperative changes of ascending colon resection with ileo colic anastomosis at the hepatic flexure. Diverticulosis of the descending and sigmoid colon. Underdistended rectum. Vascular/Lymphatic: Calcified atherosclerosis of the aorta and iliac vessels. No mesenteric lymphadenopathy. Reproductive: Prostate is unremarkable. Other: Trace pelvic ascites. Musculoskeletal: Multilevel degenerative changes of the lumbar spine. No suspicious bone lesions. IMPRESSION: 1. Cholelithiasis with gallbladder wall thickening and pericholecystic fat stranding, suspicious for acute cholecystitis. 2. Dilated common bile duct, measuring 12.5 mm. 3. Incidental cystic lesion in the pancreatic head measuring up to 2.6 cm. Follow-up pancreatic mass protocol CT or MRI is recommended in 6 months versus endoscopic ultrasound/FNA. This recommendation follows ACR consensus guidelines: Management of Incidental Pancreatic Cysts: A White Paper of the ACR Incidental Findings Committee. J Am Coll Radiol 3295;18:841-660. 4. Colonic diverticulosis. Radiology assistant personnel have been notified to put me in telephone contact with the referring physician or the referring physician's clinical representative in order to discuss these findings. Once this communication is  established I will issue an addendum to this report for documentation purposes. Electronically Signed: By: Emmit Alexanders M.D On: 04/22/2022 12:54    Procedures Procedures (including critical care time)  Medications Ordered in UC Medications  ketorolac (TORADOL) injection 30 mg (30 mg Intramuscular Given 04/22/22 1117)  iohexol (OMNIPAQUE) 300 MG/ML solution 100 mL (100 mLs Intravenous Contrast Given 04/22/22 1155)    Initial Impression / Assessment and Plan / UC Course  I have reviewed the triage vital signs and the nursing notes.  Pertinent labs & imaging results that were available during my care of the patient were reviewed by me and considered in my medical decision making (see chart for details).       Patient is a  82 y.o. male with history of colon cancer s/p resection who presents after having insidious right sided abdominal pain last night.  Overall, patient is non-toxic-appearing, well-hydrated, and in no acute distress.  Vital signs stable.  Kennethis afebrile.  Given Toradol IM for pain. Declined anti-nausea medication as nausea has resolved.   On exam, he has tenderness in the right upper and right lower quadrant.  He has history of appendectomy and recent colectomy therefore doubt acute appendicitis.  Obtain CBC, CMP and lipase.   On chart review, patient had a resection at [facility] by [place].  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  Lipase here today unremarkable.  He has no significant electrolyte abnormalities or developments in his liver or kidney function.  He does have a frank leukocytosis with a left shift, WBC 16.5.  Obtained a CT abdomen pelvis to better evaluate for intra-abdominal pathology.  He has gallbladder wall thickening measuring up to 6 mm with fat stranding and enlarged common bile duct of 12.5 mm.  He again has a 2.6 x 1.9 cm  cystic lesion of the pancreatic head.  Spoke with radiologist about these acute findings.  Patient updated with these findings and was advised to go to the emergency department.  He wants to go by private vehicle to [facility] as that is were his colon surgery was performed.  He is stable for  discharge to the ED.     Discussed MDM, treatment plan and plan for follow-up with patient/parent who agrees with plan.    Final Clinical Impressions(s) / UC Diagnoses   Final diagnoses:  Cholecystitis, acute  Gall stones  Cyst of pancreas  Right upper quadrant abdominal pain     Discharge Instructions      Your CT showed gall stones with gallbladder wall thickening and fat stranding, suspicious for acute inflammation of the gallbladder.  You also have a dilated common bile duct, measuring 12.5 mm.   Incidental cystic lesion in the pancreatic head measuring up to 2.6 cm. Follow-up pancreatic mass protocol CT or MRI is recommended in 6 months versus endoscopic ultrasound/fine needle aspiration.   You have been advised to follow up immediately in the emergency department for concerning signs or symptoms as discussed during your visit. If you declined EMS transport, please have a family member take you directly to the ED at this time. Do not delay.   Based on concerns about condition, if you do not follow up in the ED, you may risk poor outcomes including worsening of condition, delayed treatment and potentially life threatening issues. If you have declined to go to the ED at this time, you should call your PCP immediately to set up a follow up appointment.       ED Prescriptions   None    I have reviewed the PDMP during this encounter.   Lyndee Hensen, DO 04/22/22 1358

## 2022-04-22 NOTE — Discharge Instructions (Addendum)
Your CT showed gall stones with gallbladder wall thickening and fat stranding, suspicious for acute inflammation of the gallbladder.  You also have a dilated common bile duct, measuring 12.5 mm.   Incidental cystic lesion in the pancreatic head measuring up to 2.6 cm. Follow-up pancreatic mass protocol CT or MRI is recommended in 6 months versus endoscopic ultrasound/fine needle aspiration.   You have been advised to follow up immediately in the emergency department for concerning signs or symptoms as discussed during your visit. If you declined EMS transport, please have a family member take you directly to the ED at this time. Do not delay.   Based on concerns about condition, if you do not follow up in the ED, you may risk poor outcomes including worsening of condition, delayed treatment and potentially life threatening issues. If you have declined to go to the ED at this time, you should call your PCP immediately to set up a follow up appointment.

## 2022-04-22 NOTE — ED Triage Notes (Signed)
Pt c/o abdominal pain starting last night.  Pt states that the pain is in the upper abdomen.  Pt has had a bowel movement this morning and it was not complete.  Pt states that he ate a popeyes coleslaw and it made him used the restroom.   Pt has tried taking tums and yogurt and it did not help.  Pt states that his stomach is tender to touch on the right side above the hips.

## 2023-07-29 ENCOUNTER — Ambulatory Visit
Admission: EM | Admit: 2023-07-29 | Discharge: 2023-07-29 | Disposition: A | Discharge status: Home / Self Care | Attending: Family Medicine | Admitting: Family Medicine

## 2023-07-29 DIAGNOSIS — J069 Acute upper respiratory infection, unspecified: Secondary | ICD-10-CM | POA: Diagnosis present

## 2023-07-29 DIAGNOSIS — J4521 Mild intermittent asthma with (acute) exacerbation: Secondary | ICD-10-CM

## 2023-07-29 LAB — SARS CORONAVIRUS 2 BY RT PCR: SARS Coronavirus 2 by RT PCR: NEGATIVE

## 2023-07-29 MED ORDER — PREDNISONE 20 MG PO TABS
40.0000 mg | ORAL_TABLET | Freq: Every day | ORAL | 0 refills | Status: AC
Start: 2023-07-29 — End: 2023-08-03

## 2023-07-29 MED ORDER — ALBUTEROL SULFATE HFA 108 (90 BASE) MCG/ACT IN AERS: 2.0000 | INHALATION_SPRAY | RESPIRATORY_TRACT | 0 refills | Status: AC | PRN

## 2023-07-29 NOTE — ED Triage Notes (Signed)
 Patient presenting with wheezing and SOB onset Yesterday. States has had congestion but feeling a bit better today. Denies any Chronic respiratory history. Has had Bronchitis before.  Prescriptions or OTC medications tried: Yes- Mucinex Fast-Max    with moderate relief.

## 2023-07-29 NOTE — Discharge Instructions (Signed)
 The test for COVID was negative.  Albuterol inhaler--do 2 puffs every 4 hours as needed for shortness of breath or wheezing  Take prednisone 20 mg--2 daily for 5 days  You can continue to take Mucinex over-the-counter for your symptoms.

## 2023-07-29 NOTE — ED Provider Notes (Signed)
 [facility]    CSN: [id number] Arrival date & time: 07/29/23  1049      History   Chief Complaint Chief Complaint  Patient presents with   Wheezing    HPI Clinton Richardson is a 83 y.o. male.    Wheezing Here for cough and nasal congestion.  Symptoms began yesterday and then last night he felt and heard some wheezing.  The wheezing is gone away now. He did take some Mucinex with some relief.  No vomiting or diarrhea and no fever or chills.  He has had bronchitis in the past, and he states he may have used an inhaler in the past.  He is allergic to azithromycin and codeine He does not have diabetes    Past Medical History:  Diagnosis Date   Hypertension    Personal history of benign brain tumor     There are no active problems to display for this patient.   Past Surgical History:  Procedure Laterality Date   APPENDECTOMY     BRAIN SURGERY     benign brain tumor   PROSTATE SURGERY         Home Medications    Prior to Admission medications   Medication Sig Start Date End Date Taking? Authorizing Provider  albuterol (VENTOLIN HFA) 108 (90 Base) MCG/ACT inhaler Inhale 2 puffs into the lungs every 4 (four) hours as needed for wheezing or shortness of breath. 07/29/23  Yes Zenia Resides, MD  predniSONE (DELTASONE) 20 MG tablet Take 2 tablets (40 mg total) by mouth daily with breakfast for 5 days. 07/29/23 08/03/23 Yes Zenia Resides, MD  atorvastatin (LIPITOR) 10 MG tablet Take 10 mg by mouth daily.    [provider]  hydrochlorothiazide (HYDRODIURIL) 25 MG tablet Take 1 tablet by mouth daily. 04/30/17   [provider]  lisinopril (PRINIVIL,ZESTRIL) 40 MG tablet Take 1 tablet by mouth daily. 05/04/17   [provider]  Omega-3 1000 MG CAPS Take 1 tablet by mouth daily. 06/30/17   [provider]  tamsulosin (FLOMAX) 0.4 MG CAPS capsule  06/13/10   [provider]    Family History Family History   Problem Relation Age of Onset   Ovarian cancer Mother    Heart attack Father    Hypertension Father     Social History Social History   Tobacco Use   Smoking status: Former    Current packs/day: 0.00    Types: Cigarettes    Quit date: 01/11/1968    Years since quitting: 55.5   Smokeless tobacco: Never  Vaping Use   Vaping status: Never Used  Substance Use Topics   Alcohol use: Yes    Comment: rarely   Drug use: Never     Allergies   Azithromycin and Codeine   Review of Systems Review of Systems  Respiratory:  Positive for wheezing.      Physical Exam Triage Vital Signs ED Triage Vitals  Encounter Vitals Group     BP 07/29/23 1202 139/79     Systolic BP Percentile --      Diastolic BP Percentile --      Pulse Rate 07/29/23 1202 78     Resp 07/29/23 1202 18     Temp 07/29/23 1202 98 F (36.7 C)     Temp Source 07/29/23 1202 Oral     SpO2 07/29/23 1202 95 %     Weight --      Height --  Head Circumference --      Peak Flow --      Pain Score 07/29/23 1159 4     Pain Loc --      Pain Education --      Exclude from Growth Chart --    No data found.  Updated Vital Signs BP 139/79 (BP Location: Left Arm)   Pulse 78   Temp 98 F (36.7 C) (Oral)   Resp 18   SpO2 95%   Visual Acuity Right Eye Distance:   Left Eye Distance:   Bilateral Distance:    Right Eye Near:   Left Eye Near:    Bilateral Near:     Physical Exam Vitals reviewed.  Constitutional:      General: He is not in acute distress.    Appearance: He is not ill-appearing, toxic-appearing or diaphoretic.  HENT:     Right Ear: Tympanic membrane and ear canal normal.     Left Ear: Tympanic membrane and ear canal normal.     Nose: Congestion present.     Mouth/Throat:     Mouth: Mucous membranes are moist.     Pharynx: No oropharyngeal exudate or posterior oropharyngeal erythema.  Eyes:     Extraocular Movements: Extraocular movements intact.     Conjunctiva/sclera:  Conjunctivae normal.     Pupils: Pupils are equal, round, and reactive to light.  Cardiovascular:     Rate and Rhythm: Normal rate and regular rhythm.     Heart sounds: No murmur heard. Pulmonary:     Effort: Pulmonary effort is normal. No respiratory distress.     Breath sounds: No stridor. No wheezing or rales.  Musculoskeletal:     Cervical back: Neck supple.  Lymphadenopathy:     Cervical: No cervical adenopathy.  Skin:    Capillary Refill: Capillary refill takes less than 2 seconds.     Coloration: Skin is not jaundiced or pale.  Neurological:     General: No focal deficit present.     Mental Status: He is alert and oriented to person, place, and time.  Psychiatric:        Behavior: Behavior normal.      UC Treatments / Results  Labs (all labs ordered are listed, but only abnormal results are displayed) Labs Reviewed  SARS CORONAVIRUS 2 BY RT PCR    EKG   Radiology No results found.  Procedures Procedures (including critical care time)  Medications Ordered in UC Medications - No data to display  Initial Impression / Assessment and Plan / UC Course  I have reviewed the triage vital signs and the nursing notes.  Pertinent labs & imaging results that were available during my care of the patient were reviewed by me and considered in my medical decision making (see chart for details).     COVID test is negative.  Prednisone and albuterol are sent in for seems to be an asthma exacerbation. Final Clinical Impressions(s) / UC Diagnoses   Final diagnoses:  Viral upper respiratory tract infection  Mild intermittent asthma with exacerbation     Discharge Instructions      The test for COVID was negative.  Albuterol inhaler--do 2 puffs every 4 hours as needed for shortness of breath or wheezing  Take prednisone 20 mg--2 daily for 5 days  You can continue to take Mucinex over-the-counter for your symptoms.     ED Prescriptions     Medication Sig  Dispense Auth. Provider   albuterol (VENTOLIN HFA) 108 (  90 Base) MCG/ACT inhaler Inhale 2 puffs into the lungs every 4 (four) hours as needed for wheezing or shortness of breath. 1 each Ann Keto, MD   predniSONE (DELTASONE) 20 MG tablet Take 2 tablets (40 mg total) by mouth daily with breakfast for 5 days. 10 tablet Ellsworth Haas Alexismarie Flaim K, MD      PDMP not reviewed this encounter.   Ann Keto, MD 07/29/23 [phone]

## 2024-01-25 ENCOUNTER — Ambulatory Visit
